# Patient Record
Sex: Female | Born: 1960 | Race: White | Hispanic: No | Marital: Married | Smoking: Never smoker
Health system: Southern US, Community
[De-identification: ages and names within clinical notes are randomized; demographics above are authoritative.]

## PROBLEM LIST (undated history)

## (undated) DIAGNOSIS — K5190 Ulcerative colitis, unspecified, without complications: Secondary | ICD-10-CM

## (undated) HISTORY — PX: PACEMAKER IMPLANT: EP1218

## (undated) HISTORY — PX: COLOPROCTECTOMY W/ ILEO J POUCH: SUR277

---

## 1997-05-30 ENCOUNTER — Other Ambulatory Visit: Admission: RE | Admit: 1997-05-30 | Discharge: 1997-05-30 | Payer: Self-pay | Admitting: Gynecology

## 1998-05-31 ENCOUNTER — Other Ambulatory Visit: Admission: RE | Admit: 1998-05-31 | Discharge: 1998-05-31 | Payer: Self-pay | Admitting: Gynecology

## 1998-08-21 ENCOUNTER — Inpatient Hospital Stay (HOSPITAL_COMMUNITY): Admission: EM | Admit: 1998-08-21 | Discharge: 1998-08-25 | Payer: Self-pay | Admitting: Gastroenterology

## 1999-06-25 ENCOUNTER — Encounter: Payer: Self-pay | Admitting: Family Medicine

## 1999-06-25 ENCOUNTER — Encounter: Admission: RE | Admit: 1999-06-25 | Discharge: 1999-06-25 | Payer: Self-pay | Admitting: Family Medicine

## 1999-06-25 ENCOUNTER — Inpatient Hospital Stay (HOSPITAL_COMMUNITY): Admission: EM | Admit: 1999-06-25 | Discharge: 1999-06-27 | Payer: Self-pay | Admitting: Emergency Medicine

## 1999-06-27 ENCOUNTER — Encounter: Payer: Self-pay | Admitting: Internal Medicine

## 1999-10-18 ENCOUNTER — Other Ambulatory Visit: Admission: RE | Admit: 1999-10-18 | Discharge: 1999-10-18 | Payer: Self-pay | Admitting: Gynecology

## 1999-12-07 ENCOUNTER — Ambulatory Visit (HOSPITAL_COMMUNITY): Admission: RE | Admit: 1999-12-07 | Discharge: 1999-12-07 | Payer: Self-pay | Admitting: Gastroenterology

## 1999-12-07 ENCOUNTER — Encounter (INDEPENDENT_AMBULATORY_CARE_PROVIDER_SITE_OTHER): Payer: Self-pay | Admitting: *Deleted

## 1999-12-10 ENCOUNTER — Inpatient Hospital Stay (HOSPITAL_COMMUNITY): Admission: EM | Admit: 1999-12-10 | Discharge: 1999-12-16 | Payer: Self-pay | Admitting: Gastroenterology

## 1999-12-10 ENCOUNTER — Encounter: Payer: Self-pay | Admitting: Internal Medicine

## 1999-12-10 ENCOUNTER — Encounter (INDEPENDENT_AMBULATORY_CARE_PROVIDER_SITE_OTHER): Payer: Self-pay | Admitting: Specialist

## 1999-12-11 ENCOUNTER — Encounter: Payer: Self-pay | Admitting: Internal Medicine

## 1999-12-12 ENCOUNTER — Encounter: Payer: Self-pay | Admitting: Internal Medicine

## 2000-01-01 ENCOUNTER — Encounter: Payer: Self-pay | Admitting: Gastroenterology

## 2000-01-01 ENCOUNTER — Ambulatory Visit (HOSPITAL_COMMUNITY): Admission: RE | Admit: 2000-01-01 | Discharge: 2000-01-01 | Payer: Self-pay | Admitting: Obstetrics & Gynecology

## 2000-01-22 ENCOUNTER — Encounter: Payer: Self-pay | Admitting: Emergency Medicine

## 2000-01-23 ENCOUNTER — Inpatient Hospital Stay (HOSPITAL_COMMUNITY): Admission: EM | Admit: 2000-01-23 | Discharge: 2000-01-25 | Payer: Self-pay | Admitting: Emergency Medicine

## 2000-01-24 ENCOUNTER — Encounter: Payer: Self-pay | Admitting: General Surgery

## 2000-02-14 ENCOUNTER — Encounter: Payer: Self-pay | Admitting: Emergency Medicine

## 2000-02-14 ENCOUNTER — Emergency Department (HOSPITAL_COMMUNITY): Admission: EM | Admit: 2000-02-14 | Discharge: 2000-02-14 | Payer: Self-pay | Admitting: Emergency Medicine

## 2000-06-13 ENCOUNTER — Encounter (HOSPITAL_COMMUNITY): Admission: RE | Admit: 2000-06-13 | Discharge: 2000-09-11 | Payer: Self-pay | Admitting: Gastroenterology

## 2001-05-04 ENCOUNTER — Other Ambulatory Visit: Admission: RE | Admit: 2001-05-04 | Discharge: 2001-05-04 | Payer: Self-pay | Admitting: Gynecology

## 2002-04-29 ENCOUNTER — Other Ambulatory Visit: Admission: RE | Admit: 2002-04-29 | Discharge: 2002-04-29 | Payer: Self-pay | Admitting: Gynecology

## 2003-10-20 ENCOUNTER — Other Ambulatory Visit: Admission: RE | Admit: 2003-10-20 | Discharge: 2003-10-20 | Payer: Self-pay | Admitting: Gynecology

## 2004-05-21 ENCOUNTER — Encounter: Admission: RE | Admit: 2004-05-21 | Discharge: 2004-05-21 | Payer: Self-pay | Admitting: Surgery

## 2004-12-29 ENCOUNTER — Encounter: Admission: RE | Admit: 2004-12-29 | Discharge: 2004-12-29 | Payer: Self-pay | Admitting: Family Medicine

## 2005-08-02 ENCOUNTER — Other Ambulatory Visit: Admission: RE | Admit: 2005-08-02 | Discharge: 2005-08-02 | Payer: Self-pay | Admitting: Gynecology

## 2006-06-12 ENCOUNTER — Inpatient Hospital Stay (HOSPITAL_COMMUNITY): Admission: RE | Admit: 2006-06-12 | Discharge: 2006-06-16 | Payer: Self-pay | Admitting: Neurosurgery

## 2006-07-11 ENCOUNTER — Ambulatory Visit: Admission: RE | Admit: 2006-07-11 | Discharge: 2006-07-11 | Payer: Self-pay | Admitting: Neurosurgery

## 2006-07-11 ENCOUNTER — Encounter (INDEPENDENT_AMBULATORY_CARE_PROVIDER_SITE_OTHER): Payer: Self-pay | Admitting: Neurosurgery

## 2006-07-11 ENCOUNTER — Ambulatory Visit: Payer: Self-pay | Admitting: Vascular Surgery

## 2007-03-10 ENCOUNTER — Other Ambulatory Visit: Admission: RE | Admit: 2007-03-10 | Discharge: 2007-03-10 | Payer: Self-pay | Admitting: Gynecology

## 2007-06-26 ENCOUNTER — Emergency Department (HOSPITAL_COMMUNITY): Admission: EM | Admit: 2007-06-26 | Discharge: 2007-06-27 | Payer: Self-pay | Admitting: Emergency Medicine

## 2010-06-22 NOTE — Consult Note (Signed)
[facility] ([facility])  Patient:    SHAMONICA, SCHADT                       MRN: [id number] Proc. Date: 12/10/99 Adm. Date:  [id number] Attending:  [place] Cellar                          Consultation Report  HISTORY OF PRESENT ILLNESS:  I was asked to see Ms. Lackman by Dr. Sandy Salaam. Kaplan for acute abdominal pain.  This is a very pleasant 50 year old white female who has been treated for ulcerative colitis for approximately eight years with good control.  Several weeks ago, she began to experience a typical exacerbation of her ulcerative colitis with intermittent crampy lower abdominal discomfort, diarrhea, some occasional blood and mucus in her stools and back pain.  She was started on prednisone 40 mg daily two weeks ago.  She did experience some improvement, although not complete resolution of her colitis symptoms.  To follow up her colitis, she underwent a colonoscopy by Dr. [name] on December 07, 1999.  Findings were consistent with moderate acute colitis throughout the colon.  The patient had no worsening of her symptoms immediately following the colonoscopy; however, the following day, she did have some increased lower abdominal pain that was not particularly severe.  Yesterday, however, she had the fairly rapid onset of severe, constant lower abdominal pain.  This has persisted and she is admitted today for further evaluation.  She describes pain located in the central portion of her low abdomen and across to both the left lower quadrant and right lower quadrant equally.  She has not had any increase in bleeding or diarrhea and actually had had some significant improvement prior to the onset of her pain with some semiformed stools.  She has not had any nausea or vomiting.  No definite fever or chills.  The pain is worse with any motion. She denies any current urinary symptoms.  She was treated for a urinary tract infection about two weeks ago with a short  course of oral antibiotics and the dysuria she was having at that time has resolved.  She has had no abnormal vaginal bleeding or discharge.  Of significance, she was admitted by Dr. Edsel Petrin. Dalbert Batman in April of this year with right lower quadrant abdominal pain.  There was concern about appendicitis but she resolved clinically.  Her current pain is more severe and more diffusely located across the lower abdomen than her previous symptoms. The patient was admitted today by Dr. [name] for further evaluation and treatment.  PAST MEDICAL HISTORY:  Ulcerative colitis, as above.  She has had occasional UTIs; otherwise, in good health.  CURRENT MEDICATIONS 1. Prednisone 40 mg a day. 2. Levbid b.i.d. 3. Levsin p.r.n. 4. Rowasa suppositories q.h.s. 5. ProctoFoam q.a.m. 6. Asacol 400 mg daily.  ALLERGIES:  She has no known drug allergies.  SOCIAL HISTORY:  She is married and with her husband this evening.  She does not smoke cigarettes.  Does not drink alcohol.  REVIEW OF SYSTEMS:  GENERAL:  Denies weight loss, fever or chills.  HEENT: Negative.  RESPIRATORY:  No cough or sputum.  CARDIAC:  No history of chest pain or heart problems.  ABDOMEN:  As above.  EXTREMITIES:  No edema or arthritis.  PHYSICAL EXAMINATION  VITAL SIGNS:  Temperature is 97.6, pulse 80, respirations 18, blood pressure 110/60.  GENERAL:  She is a  well-developed white female who appears uncomfortable but not severely ill.  SKIN:  Warm and dry.  HEENT:  Sclerae nonicteric.  LUNGS:  Clear.  CARDIAC:  Regular rate and rhythm without murmurs.  ABDOMEN:  Bowel sounds are normoactive.  No distention.  There is moderate-to-marked lower abdominal tenderness diffusely with guarding.  This does not localize to one side or the other.  Her upper abdomen is much less tender and soft.  No palpable masses or hepatosplenomegaly.  There are lower abdominal peritoneal signs with percussion tenderness and pain with  coughing.  RECTAL:  Deferred.  Moderate diffuse colitis seen on colonoscopy, December 07, 1999.  LABORATORY AND X-RAY FINDINGS:  This is significant for an elevated white count of 22,000, hemoglobin 12.7.  Chemistries and urinalysis are pending.  CT scan of the abdomen and the pelvis was performed which I have reviewed with the radiologist.  There are a few diverticula in the sigmoid colon that do not appear inflamed.  There is no bowel wall thickening.  The appendix appears to be visualized as a normal air-filled structure.  No significant amount of free fluid.  No free air.  ASSESSMENT AND PLAN:  Acute severe lower abdominal pain with physical findings to suggest lower abdominal peritonitis.  The etiology of this is unclear.  Her exam is not well-localized for diverticulitis or appendicitis and there are no findings on CT to suggest this; her steroids could result in lack of much inflammatory change on her CT with these diagnoses, however.  There is no free air to suggest perforation but I suppose she could have had a microperforation that has sealed.  The patient appears stable at this point and with no definitive surgical problem identified on CT scan, I agree with intravenous fluids, pain medication, broad-spectrum antibiotics and close clinical followup.  If she worsens at any point or fails to improve over the next day or two, she may require a laparoscopy or laparotomy. DD:  12/10/99 TD:  12/11/99 Job: [id number]

## 2010-06-22 NOTE — Op Note (Signed)
[facility]  Patient:    [name], [name]                       MRN: [id number] Proc. Date: 12/12/99 Adm. Date:  [id number] Attending:  [place] Cellar CC:         Sandy Salaam. [name], M.D. [facility]   Operative Report  PREOPERATIVE DIAGNOSIS:  Perforated viscus.  POSTOPERATIVE DIAGNOSIS:  Perforated sigmoid colon, probable diverticulitis.  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  SURGEON:  Ward Givens, M.D.  ANESTHESIA: General.  DESCRIPTION OF PROCEDURE:  After the patient was adequately anesthetized, monitored, had Foley catheter inserted, and after routine preparation and draping of the abdomen, I made a short midline incision beginning just above and ending just below the umbilicus.  I extended it enough to get in a retractor and get my hand in to explore the abdomen.  There was no pus or evident inflammation of the upper abdomen.  I could see the duodenal sweep quite well and saw no inflammation of the pancreas or the duodenum.  I found that the transverse colon was unremarkable except adherent to a mass in the pelvis.  The stomach was likewise unremarkable.  The liver and gallbladder showed no abnormalities.  Then, I pushed the transverse colon upward and saw that a good deal of small bowel was adherent in the pelvis into an inflammatory mass of the sigmoid colon and its mesentery.  It had the appearance of diverticulitis.  I extended my incision distally to provide good exposure and packed the bowel upward, taking down the inflammatory adhesions of the small bowel to the inflammatory mass.  I thoroughly inspected the small bowel to make sure that there was no primary perforation of the small bowel or perforation due to the infection and found that the small bowel was healthy.  I saw no real thickening of the bowel wall and found only inflammatory phlegmon in the sigmoid colon.   I could not identify a definite perforated spot.  I felt that it had probably been perforated into the mesentery of the sigmoid.  I divided the sigmoid colon at mid sigmoid where it appeared to be healthy including the proximal part with Kocher clamp, then divided the mesentery, staying well up away from the retroperitoneum source to avoid any possibility of injury to his ureter.  I got down to the rectosigmoid area.  I divided the bowel distally with a linear stapler and removed the sigmoid colon.  I then assured good hemostasis in the pelvic area.  I copiously irrigated the fluid which was present at the small bowel and removed the tenacious exudate which was present on the small bowel.  I then made a small hole just superior and lateral to the umbilicus and divided the anterior rectus sheath, cut a little bit of the rectus muscle, divided the posterior rectus sheath, and brought the proximal sigmoid through.  It came up comfortably.  I then closed the fascia with a running #1 PDS, beginning at both ends and tying in the middle. Sponge, needle, and instrument counts were correct.  I closed the skin with staples and, after maturing the colostomy, packed small pieces of gauze into the ______ to provide drainage.  I removed the clamp from the colostomy and found that the mucosa was pink and healthy.  I matured it with a combination of interrupted and running 4-0 Vicryl suture and applied a  colostomy appliance.  I applied a bulky bandage.  The patient was stable through the operation. DD:  12/12/99 TD:  12/13/99 Job: [id number]

## 2010-06-22 NOTE — H&P (Signed)
[facility]  Patient:    [name], [name]                       MRN: [id number] Adm. Date:  06/25/99 Attending:  Edsel Petrin. Dalbert Batman, M.D. CC:         Cleotis Nipper, M.D.             Youlanda Roys. [name], M.D.             Tana Conch. Mezer, M.D.                         History and Physical  CHIEF COMPLAINT:  right lower quadrant abdominal pain.  HISTORY OF PRESENT ILLNESS:  This is a 50 year old white female with a six year history of ulcerative colitis.  Thirty-six hours ago at 7:30 a.m. on Jun 24, 1999, she noticed the somewhat abrupt of right lower quadrant pain. The pain has been somewhat progressive since that time.  She had no nausea or vomiting and ate well yesterday.  She has eaten less today and does not have much appetite, but still no nausea or vomiting or diarrhea.  She specifically denies having any bloody stools.  She states that this pain is different than her usual flare-up of ulcerative colitis.  She has not had any fever or chills.  She states that it hurts to sneeze or cough hard.  She saw Dr. Marisue Humble today.  CBC showed a normal white count in the office today.  A CT scan was obtained at [facility] and was basically nondiagnostic, but did show a slightly distended appendix which was nonfluid filled and no surrounding inflammation and no pelvic pathology.  I have reviewed this with Dr. Derl Barrow, and she states that this is really a nondiagnostic scan and decisions will need to be made on clinical findings alone.  The patient had more diarrhea after the CAT scan, but again saw no blood.  The patient was referred to me for evaluation and consultation.  PAST HISTORY: 1. Ulcerative colitis for 6-7 years.  She has been hospitalized a couple of    times, last hospitalization was July 2000 at which time she had bloody    stools and right lower quadrant pain. 2. She has migraine headaches. 3. 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 4. She has had a tonsillectomy. 5. She has environmental allergies. 6. Her last menstrual period was Jun 08, 1999.  CURRENT MEDICATIONS: 1. Asacol four tablets t.i.d. 2. She takes allergy shots weekly. 3. Nasonex spray q.d. 4. She take Astelin spray q.d.  DRUG ALLERGIES:  None known.  SOCIAL HISTORY:  The patient is married.  Her husband has had a vasectomy and she states that she "cant be pregnant."  They have three children.  She works as a Electrical engineer.  Her husband is a IT consultant.  She denies the use of tobacco but drinks alcohol occasionally.  FAMILY HISTORY:  Father living at 3, possibly has ulcerative colitis, but has never been worked up.  Father has non-insulin-dependent diabetes.  Mother age 79, living and well.  Three siblings living and well.  REVIEW OF SYSTEMS:  All systems are reviewed and are negative, except as described above.  PHYSICAL EXAMINATION  GENERAL:  Extremely pleasant cooperative and intelligent white female who looks well and is in no acute distress.  VITAL SIGNS:  Temperature 97.0, respiratory rate 16, heart rate  74, blood pressure 127/73.  HEENT:  Sclerae clear.  Extraocular movements intact.  Oropharynx clear.  NECK:  Supple.  Nontender.  No mass.  No thyromegaly.  No bruit.  LUNGS:  Clear to auscultation, no CVA tenderness.  CARDIAC:  Heart regular rate and rhythm.  No murmurs.  ABDOMEN:  Soft and nondistended.  Bowel sounds are present.  She is somewhat tender in the right lower quadrant but really there is no guarding, rebound, or peritoneal size.  There is no mass or hernia.  RECTAL:  Exam by Dr. Marisue Humble shows no blood.  EXTREMITIES:  Good range of motion.  No deformity.  Good pulses.  No edema.  NEUROLOGIC:  Grossly within normal limits.  ADMISSION DATA:  Hemoglobin 12.2, white blood cell count 6500, with a normal differential.  Complete metabolic panel normal.  Urinalysis pending.  IMPRESSION:  Right lower  quadrant abdominal pain, of uncertain etiology. Appendicitis is possible, but felt to be a very atypical considering the absence of nausea and vomiting and a normal white count and the equivocal CT findings.  Viral gastritis of impending flare-up of ulcerative colitis is to be considered.  PLAN:  The patient will be admitted for observation.  She was given the observation for clinical progression versus proceeding with diagnostic laparoscopy at this time.  This has been discussed with her and her husband. Risks and benefits of each approach has been outlined.  They prefer admission for observation and to forego the laparoscopy at this time.  I feel that that is reasonable considering the very unclear nature of her diagnosis.  We will obtain another blood count in about six hours.  Would consider diagnostic laparoscopy with any progression.  Will consider GI consult if not. DD:  06/25/99 TD:  06/25/99 Job: [id number]

## 2010-06-22 NOTE — Op Note (Signed)
NAMEDERRICKA, MERTZ              ACCOUNT NO.:  [id number]   MEDICAL RECORD NO.:  [id number]          PATIENT TYPE:  INP   LOCATION:  2899                         FACILITY:  [facility]   PHYSICIAN:  Otilio Connors, M.D.  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   POSTOPERATIVE DIAGNOSIS:  Unstable spondylolisthesis at L4-L5 _______  and restenosis.   PROCEDURE:  Decompressive laminectomy, decompression of the L4 and L5  nerve roots (2 levels).  Posterolateral interbody fusion at L4-L5, saber  interbody cages at L4-L5,  EXPEDIUM nonsegmented pedicle screw fixation  at L4-L5, posterolateral fusion at L4-L5 with autograft, same incision  and infused BMP (allograft).   SURGEON:  Otilio Connors, M.D.   ASSISTANT:  Ophelia Charter, M.D.   ANESTHESIA:  General endotracheal tube anesthesia.   ESTIMATED BLOOD LOSS:  100 mL.   BLOOD GIVEN:  None.   DRAINS:  None.   COMPLICATIONS:  None.   REASON FOR PROCEDURE:  The patient is a 50 year old woman who had been  having bilateral leg pain and down to the deep with numbness.  MRI and x-  rays show unstable spondylolisthesis at L4-L5 and stenosis.  The patient  brought for decompression and fusion.   DESCRIPTION OF PROCEDURE:  The patient is brought to the operating room  and general anesthesia induced.  The patient was placed in the prone  position on the Wilson frame with all pressure points padded.  The  patient was prepped and draped in a sterile fashion and was injected  with 20 mL of 1% lidocaine with epinephrine.  An incision was then made  in the midline lower lumbar spine.  Incision taken down to fascia and  hemostasis was obtained with Bovie cauterization.  The fascia was  incised and on the left side subperiosteal dissection was done with L4-  L5  spinous process out to the facet.  Mercury was placed on the _______  space and x-ray was obtained from the position of the L4-L5.  Right-  sided periosteal dissection was then dissected out over the facet at L4-  L5.  Transverse process of L5 and dissecting cephalad dissecting the  transverse process of L4 bilaterally.  Self-retaining retractors were  placed so we could see this area.  The laminectomy at L4-L5 _______ with  spinous process and ligaments intact and fasciectomy was done on both  sides.  This decompressed the central ________.  We decompressed the 5  root foraminotomy with a 5 root, completely decompressed the 4 root by  removing the facet ________ of the foramen on each side.  Once we had a  good decompression of L4 and L5 roots.  L4-L5 disk space was explored  _________ disk bulge.  Disk space was incised and to a 50 blade  diskectomy with pituitary rongeurs.  Disks were scraped with the end  plates with the various approaches, per the interbody fusion and we  prepared the interspace  for interbody cage placements.  We distracted  interspace 2 mm.  We packed two 11 high x 9 wide saber cages with  infused BMP and autograft bone.  All the bone that was removed during  laminectomy was saved, chopped up in small pieces and used during the  fusion.  We placed autograft bone into the disk space and tapped the  cage into position, removed the distraction and then placed a second  cage in that position.  We explored the cages.  We had good position of  our cages with decompression, again the thecal sac and the nerve roots.  We decorticated the transverse process of the lateral facets 4 and 5  bilaterally.  ________ fluoroscopy union and using fluoroscopy as a  guide and intraoperative markers, decorticated pedicle ________ for L4  and placed a pedicle probe ________ circumference, tapped the hole,  checked it again with the small probe and then placed a pedicle screw.  This was  repeated on the other side of L4 with 50 mm screws were used.  This process was then done for the L5 pedicles, where 45 mm screws were  placed.  Rods were placed in the screw heads, locking nuts placed and we  locked down the nuts on the L5 screw and after some compression we took  down the nuts off the L4 screws.  The rest of the infused BMP and the  rest of the autograft bone graft was then placed in the postoperative  gutters with the transverse process for an L4-L5 fusion bilaterally.  We  then explored nerve roots, fecal sac.  We had good Decompression.  There  were no bone fragments near the nerve roots.  We placed some Gelfoam  over the nerve roots so no bone fragments could fall and compress them.  Retractors were removed with good hemostasis.  Fascia closed with 0  Vicryl interrupted sutures.  Subcutaneous tissue closed with 0, 2-0 and  3-0 Vicryl interrupted suture.  Skin closed with _______ Steri-Strips  and dressing was placed.  The patient was placed back in the supine  position, awakened from anesthesia and transferred to the recovery room  in stable condition.           ______________________________  Otilio Connors, M.D.     JRH/MEDQ  D:  06/12/2006  T:  06/12/2006  Job:  [id number]

## 2010-06-22 NOTE — Discharge Summary (Signed)
[facility]  Patient:    [name], [name]                       MRN: [id number] Adm. Date:  [id number] Disc. Date: 01/25/00 Attending:  Doyce Kelly CC:         [name], M.D.  [name], M.D.  [name]. [name], M.D.   Discharge Summary  DATE OF BIRTH:                1960/08/11  DISCHARGE DIAGNOSES:           1. Partial small bowel obstruction.                                   a. Presumed secondary to adhesions.                                   b. Resolved without intervention.                                2. Dehydration secondary to #1, resolved.                                3. Hypokalemia secondary to #2, resolved.                                4. Leukocytosis, resolved.                                5. Normocytic anemia.                                   a. Admission hemoglobin 12.2.  Discharge                                      hemoglobin 9.8.                                   b. No clinical evidence of bleeding.                                6. Ulcerative colitis.                                   a. Currently on prednisone.                                7. Chemical otitis media.                                   a. ?Eustachian tube regurgitation.  8. Status post cervical conization 1993.                                   a. Dysplasia.                                9. Mild depression.                               10. Perforated diverticulum six weeks ago.                                   a. Colostomy.  ALLERGIES:                    No known drug allergies.  DISCHARGE MEDICATIONS:        1. Prednisone 20 mg q.d.  Dr. Deatra Kelly is tapering                                  this as an outpatient.                               2. Sulfasalazine 500 mg two p.o. b.i.d.                               3. Levbid and Levsin as needed for abdominal                                  cramping.                         4. Effexor XR 75 mg q.d.  CONDITION ON DISCHARGE:       Stable.  Tolerating diet.  DISPOSITION:                  Home with husband.  RECOMMENDED DIET:             Low fat as tolerated.  RECOMMENDED ACTIVITY:         As tolerated.  No strenuous.  FOLLOW-UP:                    1. The patient has an appointment to see Dr.                                  Herbie Baltimore Kelly on Wednesday, December 26 at                                  11:15.  The purpose of this visit is to                                  reexamine her ear.  If she has trouble prior  to that visit she is to call Dr. Mikle Kelly or                                  come to the walk-in clinic.                               2. Appointment with Dr. Deatra Kelly on January 10 at                                  4:00.  If she has any more abdominal problems                                  she is to contact him.  CONSULTANTS:                  [name], M.D.                               2. [name]. [name]., M.D.  PROCEDURES:                   1. Acute abdominal series (December 18).  Chest                                  x-ray normal.  Moderate dilation of the small                                  bowel with air fluid levels.  No free air.                                  Minimal amount of gas in the colon. Question                                  small bowel obstruction.  There is                                  questionable ascites.                               2. Two view abdomen (December 19).  Dilated                                  loops less prominent.  Air fluid levels                                  remain.  Small bowel obstruction.                               3.  CT scan of the abdomen and pelvis (December                                  19).  Partial small bowel obstruction best                                  localized to the distal 10-15 cm of the  ileum                                  proximal to the ileostomy.  Status post                                  colectomy with [employer], physiological.                                   Small amount of free pelvic fluid.  No                                  adnexal masses.  Uterus present.                               4. Abdominal x-ray (December 20).  Interval                                  improvement with less small bowel gas.  One                                  prominent loop present.  HOSPITAL COURSE:              #1 - PARTIAL SMALL BOWEL OBSTRUCTION:  Mrs. [name] is a 50 year old with a history of ulcerative colitis who underwent resection of 8 inches of her colon approximately six weeks ago after perforating a diverticulum.  Approximately two days prior to admission she began having abdominal discomfort and a small amount of rectal bleeding.  She was seen and evaluated by Dr. Rosana Kelly and Dr. Deon Kelly.  Her symptoms worsened and she developed nausea and vomiting.  She was admitted for IV hydration and further evaluation.  Surgery and gastroenterology were consulted.  She was placed n.p.o. and her partial small bowel obstruction resolved without NG tube placement.  On the day of discharge she was tolerating a soft, solid diet.  It is felt that this obstruction was likely due to adhesions.  She will contact us if she develops further problems.                                #2 - CHEMICAL OTITIS MEDIA:  Mrs. [name] during one of her episodes of wretching felt sudden fullness in her left ear (after vomiting).  She then developed pain on December 20.  Examination of the ear revealed a slightly erythematous ear canal, but no significant tenderness  with pinna movement.  Her eardrum was red and bulging.  We opted to follow this clinically and by the following day the pain had improved.  There is no clear indication for prophylactic antibiotics in this instance.  At the time  of  discharge she was noting some buzzing in her ear on the left side when she would lay on her side.  Visual examination revealed decreased redness to the ear canal, resolution of bulging, and mild erythema of the tympanic membrane. She was instructed to call if she had problems, then she will see Dr. Mikle Kelly on December 26 for a recheck.                                #3 - ANEMIA:  Mrs. Shedden had an admission hemoglobin of 12.  She was quite dehydrated and I suspect hemoconcentrated. Her discharge hemoglobin was 9.8.  Recommend follow up in two to three weeks and consider iron studies if indicated. DD:  01/25/00 TD:  01/25/00 Job: [id number]

## 2010-06-22 NOTE — Discharge Summary (Signed)
[place]  Patient:    [name], [name]                       MRN: [id number] Adm. Date:  [id number] Disc. Date: [id number] Attending:  Barbera Setters CC:         Cleotis Nipper, M.D.             Sandy Salaam. [name], M.D. [facility]             Liam Graham, M.D.                           Discharge Summary  FINAL DIAGNOSES: 1. Right lower quadrant pain, possible viral gastroenteritis, possible    flare-up of ulcerative colitis, doubt appendicitis. 2. Past history of ulcerative colitis. 3. Migraine headaches. 4. History of cervical dysplasia.  OPERATIONS:  None.  HISTORY:  This is a 50 year old white female with a six-year history of ulcerative colitis.  She presents with a 21 year old history of right lower quadrant pain, which had been progressive.  She denied fever, nausea, or vomiting and has been eating well.  Denies bloody stools.  She feels that this pain is different from her usual flare-ups of ulcerative colitis.  She saw Dr. Marisue Humble in the office the day of admission.  A CBC showed a normal white count.  A CT scan obtained at [facility] was nondiagnostic but did show a slightly distended appendix, which was non-fluid filled and no surrounding inflammation, and no pelvic pathology.  The radiologists have reviewed this on more than one occasion and agree that this is nondiagnostic.  She was admitted for further evaluation.  For details of her past history, social history, and family history please see the detailed admission note.  PHYSICAL EXAMINATION:  GENERAL:  A very pleasant and cooperative white female who looks well and was in no acute distress.  VITAL SIGNS:  Temperature 97.0, respiratory rate 16, heart rate 74, blood pressure 127/73.  LUNGS:  Clear to auscultation.  HEART:  Regular rate and rhythm.  No murmur.  ABDOMEN:  Soft and nondistended.  Bowel sounds present.  Somewhat tender in the right lower quadrant but  really no guarding, no rebound, and no peritoneal signs.  No masses, no hernia.  RECTAL:  Exam by Dr. Marisue Humble showed no blood.  ADMISSION LABORATORY DATA:  Hemoglobin 12.2, white cell count 6500, normal differential.  Complete metabolic panel normal.  HOSPITAL COURSE:  The patient was admitted for observation.  It was felt that her pain, physical findings, radiographic findings, and laboratory work were inconsistent with appendicitis.  It was felt that this was possible viral gastroenteritis and possible ulcerative colitis.  The patient was seen in consultation by Dr. Delfin Edis and she likewise was unclear as to the etiology of the pain.  She wondered if this was Crohns or ulcerative colitis.  She wondered about possible small bowel obstruction or hernia.  On the morning of Jun 26, 1999, the right lower quadrant pain and tenderness was diminished but had not completely resolved.  A barium enema was obtained on Jun 27, 1999 and this showed no evidence of deformity, terminal ileum looked normal.  The appendix was not definitely identified but no evidence of mass effect or inflammation.  We advanced her diet and she did well.  She was discharged home on Jun 27, 1999.  FOLLOW-UP:  She was to follow up with Dr. Herbie Baltimore  [name] on July 11, 1999 in the office.  She was to follow up with Dr. Dalbert Batman as needed.  DISCHARGE MEDICATIONS: 1. Asacol 1600 mg t.i.d. 2. Levsin 0.125 mg sublingual q.8h. p.r.n. crampy pain. 3. Continue Nasonex. 4. Continue allergy shots as before. DD:  08/08/99 TD:  08/08/99 Job: [id number]

## 2010-06-22 NOTE — Discharge Summary (Signed)
[facility]  Patient:    [name], [name]                       MRN: [id number] Adm. Date:  [id number] Disc. Date: [id number] Attending:  Clawson Cellar                           Discharge Summary  HISTORY OF PRESENT ILLNESS:  The patient is a 50 year old white female admitted to the hospital by Dr. [name] because of lower abdominal pain.  The patient was several days status post colonoscopy with biopsies in the sigmoid. She has a history of colitis and is on steroid medication.  She had been admitted for an episode of pain several months ago.  See previous records for further details.  HOSPITAL COURSE:  The patient was admitted by Dr. [name] and cared for by him. The patient was seen by myself and my associated on November 5, when was first seen by Dr. Excell Seltzer.  She had an elevated white count.  CT of the abdomen was negative, and the appendix was visualized even though it was thought she might have a appendicitis.  We continued to follow closely.  Her pain worsened, and so on November 7, a second CT was recommended.  This was done and showed pneumoperitoneum with inflammation in the pelvic area.  I saw the patient and recommended operation.  I found that she had perforation of the sigmoid colon with localized abscess and peritonitis.  I did a sigmoid colectomy with colostomy, and the patient generally recovered well thereafter.  There was no wound infection or other infectious problems.  Her colostomy functioned well, and her wound was healing nicely at time of discharge.  Pathology showed diverticulitis with pericolonic abscess and perforation.  There was no malignancy, and there was no comment made about any inflammatory disease of the colon.  DISCHARGE FOLLOWUP:  She is to see me in a couple of weeks.  Reversal of the colostomy is anticipated in a period of about four months if the patient desires and if she is felt to be in good condition for  this by Dr. [name] and his associates.  FINAL DIAGNOSES: 1. Diverticulitis of the sigmoid colon with perforation. 2. Ulcerative colitis.  OPERATIONS PERFORMED:  Sigmoid colectomy with colostomy and stapling of the distal segment.  DISCHARGE CONDITION:  Improved. DD:  01/07/00 TD:  01/07/00 Job: [id number]

## 2010-06-22 NOTE — H&P (Signed)
[name]  Patient:    [name], [name]                       MRN: [id number] Adm. Date:  [id number] Attending:  Millerton Cellar Dictator:   [name], P.A.C. CC:         [name]. Dalbert Batman, M.D.   History and Physical  CHIEF COMPLAINT:  Severe lower abdominal pain.  HISTORY:  [name] is a very nice 50 year old white female, known to Dr. Sandy Salaam. [name], who is generally in good health, but with long history of ulcerative colitis.  Patient has had a recent exacerbation over the past few weeks and had been started on prednisone 40 mg q.d. over the past two weeks; she is also on chronic Asacol therapy.  She had been on 6-MP in the past which had then been stopped and due to some ongoing active colitis over the past several months, Dr. Deatra Kelly had wanted to be resume 6-MP; however, the patient has been having insurance issues and had planned to wait until January to resume the 6-MP due to cost.  Patient says that she also had a urinary tract infection about a week and a half ago which was treated.  She underwent colonoscopy due to her recent exacerbation on Friday, December 07, 1999, with Dr. Deatra Kelly.  She had finding of patchy acute colitis throughout the colon which was active but not severe.  No changes were made in her medications. She said she felt fine Friday evening after the procedure and felt fine all day Saturday and has been having about four or five bowel movements per day with less bleeding and less lower abdominal cramping.  She said she developed lower abdominal discomfort across her lower abdomen and below the umbilicus and into the lower back Sunday morning and that this progressed somewhat through the day.  She had no dysuria or hematuria associated, no fever, but did have some chills.  She says when the pain became a bit worse, she was nauseated but had not vomited.  She had spoken with Dr. Deatra Kelly a couple of times yesterday and did  not want to come to the hospital.  He increased her Levsin and told her to take the [place] regularly as well as hydrocodone which she had at home.  She says last evening at about 9 p.m., she developed abrupt worsening of her pain which was "10 times worse" than it had been.  It has been constant and severe since that time.  She has had a couple of bowel movements through the night which increased her pain and also complains of increased rectal discomfort and spasm today.  She has not had any bloody stools through the night.  She was advised this morning to come on to the hospital for further diagnostic evaluation.  Of note, colonoscopy did show a few scattered left colon and sigmoid diverticula.  She has had no prior history of diverticulitis.  Interestingly, the patient also had an admission to [facility] in May of 2001 and was seen by Dr. Edsel Kelly. Ingram at that time with complaints of lower abdominal and predominant right lower quadrant abdominal pain.  She had a CT scan at that time which was suspicious for appendicitis; she also had a small ovarian cyst; however, she did not have any leukocytosis at that time and her pain improved rather spontaneously and therefore a decision was made not to operate.  She says this pain  is similar but more diffuse.  Patient is quite uncomfortable at the time of admission and is admitted for pain control and further diagnostic workup and surgical consultation if indicated.  CURRENT MEDICATIONS 1. Prednisone 40 mg p.o. q.d. 2. Asacol 400 mg, nine tablets q.d. 3. Hydrocodone as needed. 4. Levbid one p.o. b.i.d. 5. Levsin p.r.n. 6. Kanasa suppositories q.h.s. 7. ProctoFoam b.i.d. since December 07, 1999.  ALLERGIES:  No known drug allergies.  PAST HISTORY 1. Ulcerative colitis. 2. History of urinary tract infections. 3. Seasonal allergies.  FAMILY HISTORY:  Father with hypertension and question of ulcerative colitis. Mother alive and well.   Maternal grandmother with diabetes.  SOCIAL HISTORY:  The patient is married.  She does have children.  She is a Agricultural engineer.  No tobacco and no ETOH.  REVIEW OF SYSTEMS:  CARDIOVASCULAR AND PULMONARY:  Negative.  GENITOURINARY: Negative currently.  MUSCULOSKELETAL:  Negative currently.  GI:  As outlined above.  PHYSICAL EXAMINATION  GENERAL:  Well-developed white female in no acute distress.  She is uncomfortable-appearing but nontoxic.  She is alert and oriented x 3.  VITAL SIGNS:  Temperature is 97.6.  Blood pressure 101/53.  Pulse is 80.  HEENT:  Non-traumatic.  Normocephalic.  EOMI.  PERLA.  Sclerae anicteric. Buccal mucosa is dry.  PULMONARY:  Clear to A&P.  ABDOMEN:  Soft.  Bowel sounds are present but hypoactive.  She is diffusely tender across the lower abdomen with rebound across the lower abdomen.  No guarding, distention or mass.  RECTAL:  Exam not done today; see recent colonoscopy note, December 07, 1999.  EXTREMITIES:  Without clubbing, cyanosis, or edema.  No rash.  IMPRESSION:  [age]-year-old white female with known ulcerative colitis with recent exacerbation, status post colonoscopy, December 07, 1999, now with acute severe bilateral lower abdominal pain x 12 hours; rule out appendicitis; rule out microperforation of the colon; rule out other acute pelvic pathology. Doubt her current pain is secondary to colitis.  PLAN:  The patient is admitted to the service of Dr. Lowella Bandy. Olevia Perches, who is covering the hospital.  She will be kept n.p.o.  We will obtain STAT abdominal films and CT scan of the abdomen and pelvis, start her on IV Unasyn, IV Solu-Medrol, pain control with Demerol and Phenergan.  Likely will need surgical consultation.  For further details, please see the orders. DD:  12/10/99 TD:  12/10/99 Job: [id number]

## 2010-06-22 NOTE — H&P (Signed)
[facility]  Patient:    [name], [name]                       MRN: [id number] Adm. Date:  [id number] Attending:  Doyce Para CC:         Ward Givens, M.D.  Sandy Salaam. [name], M.D. [facility]   History and Physical  ADMISSION DIAGNOSES: 1. Abdominal pain. 2. Nausea and vomiting. 3. Ileus. 4. Leukocytosis.  HISTORY OF PRESENT ILLNESS:  The patient is a 50 year old white female with a history of ulcerative colitis status post resection of 8 inches of colon about 6 weeks ago for a perforated diverticulum who presents to the emergency room complaining of a two-day history of increasing abdominal pain, especially after eating or drinking anything. About 6 weeks ago, the patient had abdominal pain, atypical for her usual ulcerative colitis pain, and was seen by Sandy Salaam. [name], M.D. The etiology was unclear. The patient was admitted for approximately 4 days with no etiology found until repeated x-rays showed free air in her abdomen. A colonoscopy early in that admission showed active ulcerative colitis; but per the patient and husband, there was no mention of diverticulosis found on the colonoscopy. Exploratory laparotomy done by Ward Givens, M.D. revealed a perforated diverticulum, and 8 inches of her colon were resected with a colostomy placed. The patient had been recovering fairly well until about 2 days ago when she began to have abdominal pain. She also noted a small amount of rectal bleeding. There was some nausea, as well. The patient was seen December 17 by Lew Dawes. Rosana Hoes, M.D., and he felt that the pain was probably not a postoperative issue. She was also seen by Ward Givens, M.D. on December 18, and he agreed that the abdominal pain did not seem to be related to the colostomy or a postoperative etiology. He apparently commented to the patient and her husband that the small amount of rectal bleeding, however, could be a normal  postoperative finding. The patient was at home this evening trying to manage her symptoms there when she began to complain to her husband of some left leg numbness and right finger tingling which apparently produced some anxiety and hyperventilation. Her husband was unable to calm her; and so, therefore, called EMS. The husband also states that he felt that she was feverish tonight, but no temperature was taken at home. She has had very little p.o. intake for 2 to 3 days. In the emergency room, the patient demonstrated left abdominal pain with a temperature of 97 and x-rays consistent with an ileus with a white count of 19,000. Her urine specific gravity was 1.025 and positive for ketones. She will be admitted for ileus, nausea, vomiting, and dehydration. Apparently, Pricilla Riffle. Dagoberto Ligas., M.D. was called by the emergency physician, and he suggested that I be called to admit her for this problem.  PAST MEDICAL HISTORY: 1. Ulcerative colitis diagnosed in approximately 1993. Sees Sandy Salaam. [name],    M.D. for care of ulcerative colitis and gastrointestinal issues. 2. 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  CURRENT MEDICATIONS: 1. Prednisone 20 mg a day. This is being tapered down approximately 5 mg every    10 days or so. 2. Sulfasalazine 500 mg pills, 2 p.o. b.i.d. per the patient. 3. Levbid and Levsin p.r.n. intestinal cramps. 4. Effexor XR 75 mg q.d. which she has taken for about 3 weeks. Apparently,    this is  helping some postoperative depression.  ALLERGIES:  None.  FAMILY HISTORY:  Significant for her father having coronary artery disease.  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 She does not smoke and rarely drinks alcohol.  REVIEW OF SYSTEMS:  As per the history of present illness above. Additionally, there is noted to be a fairly large colostomy output today along with the nausea and vomiting. Negative review of  systems for headache, rash, or genitourinary symptoms.  PHYSICAL EXAMINATION:  GENERAL:  Reveals the patient to be drowsy after morphine and Phenergan had recently given.  VITAL SIGNS:  Blood pressure 110/70, pulse 90, respiratory rate 16.  HEENT:  Dry mucous membranes.  NECK:  No JVD.  HEART:  Regular rate and rhythm without murmur.  LUNGS:  Clear.  ABDOMEN:  Soft with decreased bowel sounds and tenderness in the left upper quadrant just superior to the colostomy site. The colostomy site actually looks to be well healed without surrounding erythema.  LABORATORY DATA:  Abdominal x-rays consistent with an ileus.  Hemoglobin of 12.2, with a white count of 19.4, and 89% neutrophils, with a platelet count of 355,000. Her amylase is normal at 121. Urinalysis shows a specific gravity of 1.026 and positive for ketones and positive for leukocyte esterase. Chemistries show a sodium of 141, with a potassium of 3.1, chloride of 107, bicarb 24, BUN 14, creatinine 1, glucose 113, albumin 3.8, SGOT 22, SGPT 15.  IMPRESSION:  The patient is a 50 year old white female with the ulcerative colitis, 6 weeks status post colostomy with a two-day history of increasing abdominal pain, nausea, vomiting, and increased colostomy output today even in the face of very limited oral intake. Her x-rays are consistent with an ileus, but she also shows some leukocytosis which is worrisome in the face of her recent diverticulosis and perforated diverticulum.  1. Abdominal pain. Unclear if this is due to a viral gastroenteritis,    diverticular disease, or another perforation. Fortunately, there is no free    air seen on her abdominal x-rays; though apparently, this also did not show    up for several days with her recent hospitalization. Will be admitted for    pain control, kept n.p.o., will begin IV Cipro and Flagyl in case    diverticulitis is the etiology. 2. Dehydration. Bolus IV fluids and continue  as a maintenance along with     replacement of potassium. 3. Steroids. Will change her from her prednisone to low-dose IV Solu-Medrol    given the questionable GI transit and absorption with her current state. 4. Positive leukocyte esterase on urinalysis. She is asymptomatic. We will    send a culture and follow up pending results. 5. Leukocytosis. Unclear if this is due to an infection, steroids, or other.    Will treat infectious etiology and follow her counts. DD:  01/23/00 TD:  01/23/00 Job: [id number]

## 2010-06-22 NOTE — Consult Note (Signed)
[facility]  Patient:    [name], [name]                       MRN: [id number] Proc. Date: 01/23/00 Adm. Date:  [id number] Attending:  Doyce Para CC:         Doyce Para, M.D.  Cleotis Nipper, M.D.   Consultation Report  REASON FOR CONSULTATION:  Small bowel obstruction.  HISTORY OF PRESENT ILLNESS:  This is a 50 year old white female with a long standing history of ulcerative colitis.  About seven weeks ago, she presented to the hospital with abdominal pain and was found to have a pneumoperitoneum. She was operated on by Dr. Georgina Quint and underwent sigmoid colectomy with colostomy for what was thought to be perforated diverticulitis.  She did well following that surgery and was discharged home.  She remains on steroids and other medications for her ulcerative colitis.  She became ill on Sunday evening, January 20, 2000.  She complains of steady, left-sided, mid abdominal pain and no bowel movement since Sunday evening December 16.  Yesterday, she had several episodes of vomiting and was admitted.  Since that time, she has had a large amount of stool per colostomy and feels much better.  She states that her nausea has resolved.  She is hungry and she has a minimal amount of soreness.  When she was admitted late last night, her white blood cell count was 19,000. That has gone down to 14,000 today.  Her abdominal films yesterday are consistent with a high grade partial distal small bowel obstruction.  X-rays today look much less dramatic, although they are still consistent with partial small bowel obstruction, but now with much more gas in the colon.  She underwent a CT scan this evening, which is consistent with partial distal small bowel obstruction with some dilated proximal small bowel and collapsed distal small bowel.  There is no abscess or fluid collection.  There is no evidence of bowel ischemia.  PAST MEDICAL HISTORY: 1.  Ulcerative colitis diagnosed in 1993, followed by Dr. Erskine Emery. 2. Status post cervical conization secondary to dysplasia in 1993. 3. Status post sigmoid colectomy with colostomy (Hartmann procedure) six weeks    ago.  CURRENT MEDICATIONS: 1. Prednisone 20 mg q.d. with a slow taper going on. 2. Sulfasalazine 500 mg, two tablets b.i.d. (per patient). 3. Levbid p.r.n. 4. Levsin p.r.n. 5. Effexor XR 75 mg q.d. x 3 weeks for postoperative depressive symptoms.  DRUG ALLERGIES:  None known.  FAMILY HISTORY:  Her father has coronary artery disease.  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  No tobacco.  Rare alcohol.  PHYSICAL EXAMINATION:  GENERAL:  A very pleasant young, middle-aged white female sitting up in a chair in no distress.  VITAL SIGNS:  Temperature 97.0, blood pressure 110/70, pulse 90 and regular, respiratory rate 16.  HEENT:  Sclerae clear.  Extraocular movements intact.  Mucous membranes dry.  HEART:  Regular rate and rhythm without murmur.  LUNGS:  Clear to auscultation.  No CVA tenderness.  ABDOMEN:  Soft.  Bowel sounds  are present.  The abdomen is not distended to exam.  There is mild tenderness in the left mid abdomen around the stoma, but there is no hernia, no cellulitis, no mass, and no abscess.  She is not guarding.  She has lots of liquid, light brown stool in the colostomy bag.  IMPRESSION:  Partial distal  small bowel obstruction, almost certainly secondary to adhesions from her recent laparotomy and colectomy.  No evidence of abscess.  No evidence of hernia.  PLAN:  At this time, the patient seems to be getting better spontaneously and would opt for non-operative management with clinical observation, n.p.o. status and IV fluid hydration.  We will repeat lab work and x-rays tomorrow morning.  If she vomits, she certainly would need an NG tube, although she is very opposed to that at this time.  Would  reserve laparotomy and lysis of adhesions as a last resort. DD:  01/23/00 TD:  01/24/00 Job: [id number]

## 2010-06-22 NOTE — Discharge Summary (Signed)
NAMESUPRIYA, BEASTON              ACCOUNT NO.:  [id number]   MEDICAL RECORD NO.:  [id number]          PATIENT TYPE:  INP   LOCATION:  3013                         FACILITY:  [facility]   PHYSICIAN:  Otilio Connors, M.D.  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   DISCHARGE DIAGNOSIS:  Unstable spondylolisthesis L4-5 with recessed  stenosis and spondylosis.   PROCEDURE:  Decompression, laminectomy and decompression at L4 and L5.  Posterolateral fusion of L4-5 at the sacral interbody cages.  Nonsegmented pedicle screw fixation.  Posterolateral fusion of Allograft and infuse and BNP.   REASON FOR ADMISSION:  The patient is a 50 year old woman who has been  having a lot of leg pain and numbness with back pain.  MRI and x-ray  show unstable spondylolisthesis and stenosis at L4-5.  The patient  brought in for decompression and fusion.   HOSPITAL COURSE:  The patient is admitted the day of surgery and  underwent procedure without complication to postop.  The patient was  transferred to the recovery room and then the floor.  There she thought  she had some less pain radiating down to her feet.  She had some  significant incisional pain that started increasing after activity.  Physical therapy worked with the patient to increase mobilization  including the L4-5 working on the brace.  She started mobilizing fairly  slowly and on Jun 15, 2006, was changed to p.o. pain medications.  She  continued making some progress.  Did have episodes of spasms.  She used  muscle relaxants for that.  The incision remained dry and intact.  She  was discharged home in stable condition on Jun 16, 2006.   DISCHARGE MEDICATIONS:  Remained the same as pre-hospitalization plus  Percocet and Flexeril p.r.n.  Wear her brace when up.   No strenuous  activity.  Follow up in 3 weeks in my office.           ______________________________  Otilio Connors, M.D.     JRH/MEDQ  D:  08/05/2006  T:  08/05/2006  Job:  [id number]

## 2011-09-04 DIAGNOSIS — K91850 Pouchitis: Secondary | ICD-10-CM | POA: Insufficient documentation

## 2014-07-12 ENCOUNTER — Other Ambulatory Visit: Payer: Self-pay | Admitting: Gynecology

## 2014-07-13 LAB — CYTOLOGY - PAP

## 2014-09-22 DIAGNOSIS — J342 Deviated nasal septum: Secondary | ICD-10-CM | POA: Insufficient documentation

## 2014-11-08 DIAGNOSIS — K219 Gastro-esophageal reflux disease without esophagitis: Secondary | ICD-10-CM | POA: Insufficient documentation

## 2014-11-08 DIAGNOSIS — G43909 Migraine, unspecified, not intractable, without status migrainosus: Secondary | ICD-10-CM | POA: Insufficient documentation

## 2014-11-08 DIAGNOSIS — G629 Polyneuropathy, unspecified: Secondary | ICD-10-CM | POA: Insufficient documentation

## 2014-11-08 DIAGNOSIS — K5190 Ulcerative colitis, unspecified, without complications: Secondary | ICD-10-CM | POA: Insufficient documentation

## 2014-11-08 DIAGNOSIS — G2581 Restless legs syndrome: Secondary | ICD-10-CM | POA: Insufficient documentation

## 2015-05-08 DIAGNOSIS — Z885 Allergy status to narcotic agent status: Secondary | ICD-10-CM | POA: Diagnosis not present

## 2015-05-08 DIAGNOSIS — Z91040 Latex allergy status: Secondary | ICD-10-CM | POA: Diagnosis not present

## 2015-05-08 DIAGNOSIS — K91850 Pouchitis: Secondary | ICD-10-CM | POA: Diagnosis not present

## 2015-05-08 DIAGNOSIS — R195 Other fecal abnormalities: Secondary | ICD-10-CM | POA: Diagnosis not present

## 2015-05-08 DIAGNOSIS — Z87891 Personal history of nicotine dependence: Secondary | ICD-10-CM | POA: Diagnosis not present

## 2015-05-08 DIAGNOSIS — R159 Full incontinence of feces: Secondary | ICD-10-CM | POA: Diagnosis not present

## 2015-05-08 DIAGNOSIS — R278 Other lack of coordination: Secondary | ICD-10-CM | POA: Diagnosis not present

## 2015-05-09 DIAGNOSIS — M545 Low back pain: Secondary | ICD-10-CM | POA: Diagnosis not present

## 2015-05-09 DIAGNOSIS — G039 Meningitis, unspecified: Secondary | ICD-10-CM | POA: Diagnosis not present

## 2015-05-12 DIAGNOSIS — J3081 Allergic rhinitis due to animal (cat) (dog) hair and dander: Secondary | ICD-10-CM | POA: Diagnosis not present

## 2015-05-12 DIAGNOSIS — J301 Allergic rhinitis due to pollen: Secondary | ICD-10-CM | POA: Diagnosis not present

## 2015-05-12 DIAGNOSIS — J3089 Other allergic rhinitis: Secondary | ICD-10-CM | POA: Diagnosis not present

## 2015-05-15 DIAGNOSIS — R195 Other fecal abnormalities: Secondary | ICD-10-CM | POA: Diagnosis not present

## 2015-05-15 DIAGNOSIS — Z87891 Personal history of nicotine dependence: Secondary | ICD-10-CM | POA: Diagnosis not present

## 2015-05-15 DIAGNOSIS — Z91040 Latex allergy status: Secondary | ICD-10-CM | POA: Diagnosis not present

## 2015-05-15 DIAGNOSIS — R278 Other lack of coordination: Secondary | ICD-10-CM | POA: Diagnosis not present

## 2015-05-15 DIAGNOSIS — R159 Full incontinence of feces: Secondary | ICD-10-CM | POA: Diagnosis not present

## 2015-05-15 DIAGNOSIS — K91850 Pouchitis: Secondary | ICD-10-CM | POA: Diagnosis not present

## 2015-05-15 DIAGNOSIS — Z885 Allergy status to narcotic agent status: Secondary | ICD-10-CM | POA: Diagnosis not present

## 2015-05-16 DIAGNOSIS — J3089 Other allergic rhinitis: Secondary | ICD-10-CM | POA: Diagnosis not present

## 2015-05-16 DIAGNOSIS — J3081 Allergic rhinitis due to animal (cat) (dog) hair and dander: Secondary | ICD-10-CM | POA: Diagnosis not present

## 2015-05-16 DIAGNOSIS — J301 Allergic rhinitis due to pollen: Secondary | ICD-10-CM | POA: Diagnosis not present

## 2015-05-26 DIAGNOSIS — J3089 Other allergic rhinitis: Secondary | ICD-10-CM | POA: Diagnosis not present

## 2015-05-26 DIAGNOSIS — R159 Full incontinence of feces: Secondary | ICD-10-CM | POA: Diagnosis not present

## 2015-05-26 DIAGNOSIS — Z87891 Personal history of nicotine dependence: Secondary | ICD-10-CM | POA: Diagnosis not present

## 2015-05-26 DIAGNOSIS — K91850 Pouchitis: Secondary | ICD-10-CM | POA: Diagnosis not present

## 2015-05-26 DIAGNOSIS — Z885 Allergy status to narcotic agent status: Secondary | ICD-10-CM | POA: Diagnosis not present

## 2015-05-26 DIAGNOSIS — R195 Other fecal abnormalities: Secondary | ICD-10-CM | POA: Diagnosis not present

## 2015-05-26 DIAGNOSIS — R278 Other lack of coordination: Secondary | ICD-10-CM | POA: Diagnosis not present

## 2015-05-26 DIAGNOSIS — Z91040 Latex allergy status: Secondary | ICD-10-CM | POA: Diagnosis not present

## 2015-05-26 DIAGNOSIS — J301 Allergic rhinitis due to pollen: Secondary | ICD-10-CM | POA: Diagnosis not present

## 2015-05-26 DIAGNOSIS — J3081 Allergic rhinitis due to animal (cat) (dog) hair and dander: Secondary | ICD-10-CM | POA: Diagnosis not present

## 2015-05-30 DIAGNOSIS — M545 Low back pain: Secondary | ICD-10-CM | POA: Diagnosis not present

## 2015-06-02 DIAGNOSIS — J3089 Other allergic rhinitis: Secondary | ICD-10-CM | POA: Diagnosis not present

## 2015-06-02 DIAGNOSIS — J301 Allergic rhinitis due to pollen: Secondary | ICD-10-CM | POA: Diagnosis not present

## 2015-06-02 DIAGNOSIS — J3081 Allergic rhinitis due to animal (cat) (dog) hair and dander: Secondary | ICD-10-CM | POA: Diagnosis not present

## 2015-06-05 DIAGNOSIS — R159 Full incontinence of feces: Secondary | ICD-10-CM | POA: Diagnosis not present

## 2015-06-05 DIAGNOSIS — Z87891 Personal history of nicotine dependence: Secondary | ICD-10-CM | POA: Diagnosis not present

## 2015-06-05 DIAGNOSIS — R278 Other lack of coordination: Secondary | ICD-10-CM | POA: Diagnosis not present

## 2015-06-05 DIAGNOSIS — Z885 Allergy status to narcotic agent status: Secondary | ICD-10-CM | POA: Diagnosis not present

## 2015-06-05 DIAGNOSIS — Z91040 Latex allergy status: Secondary | ICD-10-CM | POA: Diagnosis not present

## 2015-06-05 DIAGNOSIS — R195 Other fecal abnormalities: Secondary | ICD-10-CM | POA: Diagnosis not present

## 2015-06-05 DIAGNOSIS — K91850 Pouchitis: Secondary | ICD-10-CM | POA: Diagnosis not present

## 2015-06-07 DIAGNOSIS — J301 Allergic rhinitis due to pollen: Secondary | ICD-10-CM | POA: Diagnosis not present

## 2015-06-07 DIAGNOSIS — J3089 Other allergic rhinitis: Secondary | ICD-10-CM | POA: Diagnosis not present

## 2015-06-07 DIAGNOSIS — J3081 Allergic rhinitis due to animal (cat) (dog) hair and dander: Secondary | ICD-10-CM | POA: Diagnosis not present

## 2015-06-08 ENCOUNTER — Encounter: Payer: Self-pay | Admitting: Obstetrics and Gynecology

## 2015-06-12 ENCOUNTER — Encounter: Payer: Self-pay | Admitting: Obstetrics and Gynecology

## 2015-06-13 DIAGNOSIS — J301 Allergic rhinitis due to pollen: Secondary | ICD-10-CM | POA: Diagnosis not present

## 2015-06-13 DIAGNOSIS — J3089 Other allergic rhinitis: Secondary | ICD-10-CM | POA: Diagnosis not present

## 2015-06-13 DIAGNOSIS — J3081 Allergic rhinitis due to animal (cat) (dog) hair and dander: Secondary | ICD-10-CM | POA: Diagnosis not present

## 2015-06-19 DIAGNOSIS — J3089 Other allergic rhinitis: Secondary | ICD-10-CM | POA: Diagnosis not present

## 2015-06-19 DIAGNOSIS — J3081 Allergic rhinitis due to animal (cat) (dog) hair and dander: Secondary | ICD-10-CM | POA: Diagnosis not present

## 2015-06-19 DIAGNOSIS — J301 Allergic rhinitis due to pollen: Secondary | ICD-10-CM | POA: Diagnosis not present

## 2015-06-22 DIAGNOSIS — J301 Allergic rhinitis due to pollen: Secondary | ICD-10-CM | POA: Diagnosis not present

## 2015-06-22 DIAGNOSIS — J3089 Other allergic rhinitis: Secondary | ICD-10-CM | POA: Diagnosis not present

## 2015-07-04 DIAGNOSIS — J3081 Allergic rhinitis due to animal (cat) (dog) hair and dander: Secondary | ICD-10-CM | POA: Diagnosis not present

## 2015-07-04 DIAGNOSIS — J301 Allergic rhinitis due to pollen: Secondary | ICD-10-CM | POA: Diagnosis not present

## 2015-07-04 DIAGNOSIS — J3089 Other allergic rhinitis: Secondary | ICD-10-CM | POA: Diagnosis not present

## 2015-07-06 DIAGNOSIS — J3089 Other allergic rhinitis: Secondary | ICD-10-CM | POA: Diagnosis not present

## 2015-07-06 DIAGNOSIS — J3081 Allergic rhinitis due to animal (cat) (dog) hair and dander: Secondary | ICD-10-CM | POA: Diagnosis not present

## 2015-07-06 DIAGNOSIS — J301 Allergic rhinitis due to pollen: Secondary | ICD-10-CM | POA: Diagnosis not present

## 2015-07-10 DIAGNOSIS — N8184 Pelvic muscle wasting: Secondary | ICD-10-CM | POA: Diagnosis not present

## 2015-07-10 DIAGNOSIS — K91850 Pouchitis: Secondary | ICD-10-CM | POA: Diagnosis not present

## 2015-07-10 DIAGNOSIS — Z885 Allergy status to narcotic agent status: Secondary | ICD-10-CM | POA: Diagnosis not present

## 2015-07-10 DIAGNOSIS — R279 Unspecified lack of coordination: Secondary | ICD-10-CM | POA: Diagnosis not present

## 2015-07-10 DIAGNOSIS — R159 Full incontinence of feces: Secondary | ICD-10-CM | POA: Diagnosis not present

## 2015-07-10 DIAGNOSIS — Z91040 Latex allergy status: Secondary | ICD-10-CM | POA: Diagnosis not present

## 2015-07-10 DIAGNOSIS — Z87891 Personal history of nicotine dependence: Secondary | ICD-10-CM | POA: Diagnosis not present

## 2015-07-10 DIAGNOSIS — R195 Other fecal abnormalities: Secondary | ICD-10-CM | POA: Diagnosis not present

## 2015-07-10 DIAGNOSIS — R278 Other lack of coordination: Secondary | ICD-10-CM | POA: Diagnosis not present

## 2015-07-12 DIAGNOSIS — J3081 Allergic rhinitis due to animal (cat) (dog) hair and dander: Secondary | ICD-10-CM | POA: Diagnosis not present

## 2015-07-12 DIAGNOSIS — J301 Allergic rhinitis due to pollen: Secondary | ICD-10-CM | POA: Diagnosis not present

## 2015-07-12 DIAGNOSIS — J3089 Other allergic rhinitis: Secondary | ICD-10-CM | POA: Diagnosis not present

## 2015-07-21 DIAGNOSIS — J3089 Other allergic rhinitis: Secondary | ICD-10-CM | POA: Diagnosis not present

## 2015-07-21 DIAGNOSIS — J3081 Allergic rhinitis due to animal (cat) (dog) hair and dander: Secondary | ICD-10-CM | POA: Diagnosis not present

## 2015-07-21 DIAGNOSIS — J301 Allergic rhinitis due to pollen: Secondary | ICD-10-CM | POA: Diagnosis not present

## 2015-07-27 DIAGNOSIS — J3089 Other allergic rhinitis: Secondary | ICD-10-CM | POA: Diagnosis not present

## 2015-07-27 DIAGNOSIS — J3081 Allergic rhinitis due to animal (cat) (dog) hair and dander: Secondary | ICD-10-CM | POA: Diagnosis not present

## 2015-07-27 DIAGNOSIS — J301 Allergic rhinitis due to pollen: Secondary | ICD-10-CM | POA: Diagnosis not present

## 2015-08-03 DIAGNOSIS — J3089 Other allergic rhinitis: Secondary | ICD-10-CM | POA: Diagnosis not present

## 2015-08-03 DIAGNOSIS — J301 Allergic rhinitis due to pollen: Secondary | ICD-10-CM | POA: Diagnosis not present

## 2015-08-03 DIAGNOSIS — J3081 Allergic rhinitis due to animal (cat) (dog) hair and dander: Secondary | ICD-10-CM | POA: Diagnosis not present

## 2015-08-07 DIAGNOSIS — M5416 Radiculopathy, lumbar region: Secondary | ICD-10-CM | POA: Diagnosis not present

## 2015-08-07 DIAGNOSIS — M545 Low back pain: Secondary | ICD-10-CM | POA: Diagnosis not present

## 2015-08-07 DIAGNOSIS — G039 Meningitis, unspecified: Secondary | ICD-10-CM | POA: Diagnosis not present

## 2015-08-09 DIAGNOSIS — J3081 Allergic rhinitis due to animal (cat) (dog) hair and dander: Secondary | ICD-10-CM | POA: Diagnosis not present

## 2015-08-09 DIAGNOSIS — J301 Allergic rhinitis due to pollen: Secondary | ICD-10-CM | POA: Diagnosis not present

## 2015-08-09 DIAGNOSIS — J3089 Other allergic rhinitis: Secondary | ICD-10-CM | POA: Diagnosis not present

## 2015-08-16 DIAGNOSIS — J301 Allergic rhinitis due to pollen: Secondary | ICD-10-CM | POA: Diagnosis not present

## 2015-08-16 DIAGNOSIS — J3081 Allergic rhinitis due to animal (cat) (dog) hair and dander: Secondary | ICD-10-CM | POA: Diagnosis not present

## 2015-08-16 DIAGNOSIS — J3089 Other allergic rhinitis: Secondary | ICD-10-CM | POA: Diagnosis not present

## 2015-08-22 DIAGNOSIS — J301 Allergic rhinitis due to pollen: Secondary | ICD-10-CM | POA: Diagnosis not present

## 2015-08-22 DIAGNOSIS — J3089 Other allergic rhinitis: Secondary | ICD-10-CM | POA: Diagnosis not present

## 2015-08-22 DIAGNOSIS — J3081 Allergic rhinitis due to animal (cat) (dog) hair and dander: Secondary | ICD-10-CM | POA: Diagnosis not present

## 2015-08-22 DIAGNOSIS — Z01419 Encounter for gynecological examination (general) (routine) without abnormal findings: Secondary | ICD-10-CM | POA: Diagnosis not present

## 2015-08-22 DIAGNOSIS — Z6827 Body mass index (BMI) 27.0-27.9, adult: Secondary | ICD-10-CM | POA: Diagnosis not present

## 2015-08-22 DIAGNOSIS — Z1231 Encounter for screening mammogram for malignant neoplasm of breast: Secondary | ICD-10-CM | POA: Diagnosis not present

## 2015-08-28 DIAGNOSIS — J301 Allergic rhinitis due to pollen: Secondary | ICD-10-CM | POA: Diagnosis not present

## 2015-08-28 DIAGNOSIS — J3081 Allergic rhinitis due to animal (cat) (dog) hair and dander: Secondary | ICD-10-CM | POA: Diagnosis not present

## 2015-08-28 DIAGNOSIS — J3089 Other allergic rhinitis: Secondary | ICD-10-CM | POA: Diagnosis not present

## 2015-09-04 DIAGNOSIS — J301 Allergic rhinitis due to pollen: Secondary | ICD-10-CM | POA: Diagnosis not present

## 2015-09-04 DIAGNOSIS — G039 Meningitis, unspecified: Secondary | ICD-10-CM | POA: Diagnosis not present

## 2015-09-04 DIAGNOSIS — J3089 Other allergic rhinitis: Secondary | ICD-10-CM | POA: Diagnosis not present

## 2015-09-04 DIAGNOSIS — M545 Low back pain: Secondary | ICD-10-CM | POA: Diagnosis not present

## 2015-09-04 DIAGNOSIS — J3081 Allergic rhinitis due to animal (cat) (dog) hair and dander: Secondary | ICD-10-CM | POA: Diagnosis not present

## 2015-09-04 DIAGNOSIS — M5416 Radiculopathy, lumbar region: Secondary | ICD-10-CM | POA: Diagnosis not present

## 2015-09-14 DIAGNOSIS — J3089 Other allergic rhinitis: Secondary | ICD-10-CM | POA: Diagnosis not present

## 2015-09-14 DIAGNOSIS — J301 Allergic rhinitis due to pollen: Secondary | ICD-10-CM | POA: Diagnosis not present

## 2015-09-14 DIAGNOSIS — J3081 Allergic rhinitis due to animal (cat) (dog) hair and dander: Secondary | ICD-10-CM | POA: Diagnosis not present

## 2015-09-21 DIAGNOSIS — J3081 Allergic rhinitis due to animal (cat) (dog) hair and dander: Secondary | ICD-10-CM | POA: Diagnosis not present

## 2015-09-21 DIAGNOSIS — J3089 Other allergic rhinitis: Secondary | ICD-10-CM | POA: Diagnosis not present

## 2015-09-21 DIAGNOSIS — J301 Allergic rhinitis due to pollen: Secondary | ICD-10-CM | POA: Diagnosis not present

## 2015-09-28 DIAGNOSIS — J3081 Allergic rhinitis due to animal (cat) (dog) hair and dander: Secondary | ICD-10-CM | POA: Diagnosis not present

## 2015-09-28 DIAGNOSIS — J3089 Other allergic rhinitis: Secondary | ICD-10-CM | POA: Diagnosis not present

## 2015-09-28 DIAGNOSIS — J301 Allergic rhinitis due to pollen: Secondary | ICD-10-CM | POA: Diagnosis not present

## 2015-10-02 DIAGNOSIS — J301 Allergic rhinitis due to pollen: Secondary | ICD-10-CM | POA: Diagnosis not present

## 2015-10-02 DIAGNOSIS — J3089 Other allergic rhinitis: Secondary | ICD-10-CM | POA: Diagnosis not present

## 2015-10-02 DIAGNOSIS — J3081 Allergic rhinitis due to animal (cat) (dog) hair and dander: Secondary | ICD-10-CM | POA: Diagnosis not present

## 2015-10-11 DIAGNOSIS — J3081 Allergic rhinitis due to animal (cat) (dog) hair and dander: Secondary | ICD-10-CM | POA: Diagnosis not present

## 2015-10-11 DIAGNOSIS — J3089 Other allergic rhinitis: Secondary | ICD-10-CM | POA: Diagnosis not present

## 2015-10-11 DIAGNOSIS — J301 Allergic rhinitis due to pollen: Secondary | ICD-10-CM | POA: Diagnosis not present

## 2015-10-19 DIAGNOSIS — Z23 Encounter for immunization: Secondary | ICD-10-CM | POA: Diagnosis not present

## 2015-10-19 DIAGNOSIS — J3081 Allergic rhinitis due to animal (cat) (dog) hair and dander: Secondary | ICD-10-CM | POA: Diagnosis not present

## 2015-10-19 DIAGNOSIS — G2581 Restless legs syndrome: Secondary | ICD-10-CM | POA: Diagnosis not present

## 2015-10-19 DIAGNOSIS — F322 Major depressive disorder, single episode, severe without psychotic features: Secondary | ICD-10-CM | POA: Diagnosis not present

## 2015-10-19 DIAGNOSIS — Z0000 Encounter for general adult medical examination without abnormal findings: Secondary | ICD-10-CM | POA: Diagnosis not present

## 2015-10-19 DIAGNOSIS — J3089 Other allergic rhinitis: Secondary | ICD-10-CM | POA: Diagnosis not present

## 2015-10-19 DIAGNOSIS — R51 Headache: Secondary | ICD-10-CM | POA: Diagnosis not present

## 2015-10-19 DIAGNOSIS — J301 Allergic rhinitis due to pollen: Secondary | ICD-10-CM | POA: Diagnosis not present

## 2015-10-23 DIAGNOSIS — M5416 Radiculopathy, lumbar region: Secondary | ICD-10-CM | POA: Diagnosis not present

## 2015-10-23 DIAGNOSIS — G039 Meningitis, unspecified: Secondary | ICD-10-CM | POA: Diagnosis not present

## 2015-10-23 DIAGNOSIS — M545 Low back pain: Secondary | ICD-10-CM | POA: Diagnosis not present

## 2015-10-26 DIAGNOSIS — J301 Allergic rhinitis due to pollen: Secondary | ICD-10-CM | POA: Diagnosis not present

## 2015-10-27 DIAGNOSIS — J3089 Other allergic rhinitis: Secondary | ICD-10-CM | POA: Diagnosis not present

## 2015-10-27 DIAGNOSIS — J3081 Allergic rhinitis due to animal (cat) (dog) hair and dander: Secondary | ICD-10-CM | POA: Diagnosis not present

## 2015-10-27 DIAGNOSIS — J301 Allergic rhinitis due to pollen: Secondary | ICD-10-CM | POA: Diagnosis not present

## 2015-10-31 DIAGNOSIS — J301 Allergic rhinitis due to pollen: Secondary | ICD-10-CM | POA: Diagnosis not present

## 2015-10-31 DIAGNOSIS — J3089 Other allergic rhinitis: Secondary | ICD-10-CM | POA: Diagnosis not present

## 2015-10-31 DIAGNOSIS — J3081 Allergic rhinitis due to animal (cat) (dog) hair and dander: Secondary | ICD-10-CM | POA: Diagnosis not present

## 2015-11-09 DIAGNOSIS — J301 Allergic rhinitis due to pollen: Secondary | ICD-10-CM | POA: Diagnosis not present

## 2015-11-09 DIAGNOSIS — J3081 Allergic rhinitis due to animal (cat) (dog) hair and dander: Secondary | ICD-10-CM | POA: Diagnosis not present

## 2015-11-09 DIAGNOSIS — J3089 Other allergic rhinitis: Secondary | ICD-10-CM | POA: Diagnosis not present

## 2015-11-14 DIAGNOSIS — J3089 Other allergic rhinitis: Secondary | ICD-10-CM | POA: Diagnosis not present

## 2015-11-14 DIAGNOSIS — J301 Allergic rhinitis due to pollen: Secondary | ICD-10-CM | POA: Diagnosis not present

## 2015-11-14 DIAGNOSIS — J3081 Allergic rhinitis due to animal (cat) (dog) hair and dander: Secondary | ICD-10-CM | POA: Diagnosis not present

## 2015-11-24 DIAGNOSIS — R300 Dysuria: Secondary | ICD-10-CM | POA: Diagnosis not present

## 2015-11-24 DIAGNOSIS — G8929 Other chronic pain: Secondary | ICD-10-CM | POA: Diagnosis not present

## 2015-11-24 DIAGNOSIS — N939 Abnormal uterine and vaginal bleeding, unspecified: Secondary | ICD-10-CM | POA: Diagnosis not present

## 2015-11-24 DIAGNOSIS — M545 Low back pain: Secondary | ICD-10-CM | POA: Diagnosis not present

## 2015-11-24 DIAGNOSIS — M5416 Radiculopathy, lumbar region: Secondary | ICD-10-CM | POA: Diagnosis not present

## 2015-11-24 DIAGNOSIS — R51 Headache: Secondary | ICD-10-CM | POA: Diagnosis not present

## 2015-11-27 DIAGNOSIS — N951 Menopausal and female climacteric states: Secondary | ICD-10-CM | POA: Diagnosis not present

## 2015-11-27 DIAGNOSIS — J3081 Allergic rhinitis due to animal (cat) (dog) hair and dander: Secondary | ICD-10-CM | POA: Diagnosis not present

## 2015-11-27 DIAGNOSIS — J3089 Other allergic rhinitis: Secondary | ICD-10-CM | POA: Diagnosis not present

## 2015-11-27 DIAGNOSIS — J301 Allergic rhinitis due to pollen: Secondary | ICD-10-CM | POA: Diagnosis not present

## 2015-11-27 DIAGNOSIS — N950 Postmenopausal bleeding: Secondary | ICD-10-CM | POA: Diagnosis not present

## 2015-12-06 DIAGNOSIS — J301 Allergic rhinitis due to pollen: Secondary | ICD-10-CM | POA: Diagnosis not present

## 2015-12-06 DIAGNOSIS — J3089 Other allergic rhinitis: Secondary | ICD-10-CM | POA: Diagnosis not present

## 2015-12-06 DIAGNOSIS — J3081 Allergic rhinitis due to animal (cat) (dog) hair and dander: Secondary | ICD-10-CM | POA: Diagnosis not present

## 2015-12-08 DIAGNOSIS — J301 Allergic rhinitis due to pollen: Secondary | ICD-10-CM | POA: Diagnosis not present

## 2015-12-08 DIAGNOSIS — J3089 Other allergic rhinitis: Secondary | ICD-10-CM | POA: Diagnosis not present

## 2015-12-08 DIAGNOSIS — J3081 Allergic rhinitis due to animal (cat) (dog) hair and dander: Secondary | ICD-10-CM | POA: Diagnosis not present

## 2015-12-20 DIAGNOSIS — J3089 Other allergic rhinitis: Secondary | ICD-10-CM | POA: Diagnosis not present

## 2015-12-20 DIAGNOSIS — J3081 Allergic rhinitis due to animal (cat) (dog) hair and dander: Secondary | ICD-10-CM | POA: Diagnosis not present

## 2015-12-20 DIAGNOSIS — J301 Allergic rhinitis due to pollen: Secondary | ICD-10-CM | POA: Diagnosis not present

## 2015-12-27 DIAGNOSIS — J3089 Other allergic rhinitis: Secondary | ICD-10-CM | POA: Diagnosis not present

## 2015-12-27 DIAGNOSIS — J3081 Allergic rhinitis due to animal (cat) (dog) hair and dander: Secondary | ICD-10-CM | POA: Diagnosis not present

## 2015-12-27 DIAGNOSIS — J301 Allergic rhinitis due to pollen: Secondary | ICD-10-CM | POA: Diagnosis not present

## 2016-01-03 DIAGNOSIS — J3089 Other allergic rhinitis: Secondary | ICD-10-CM | POA: Diagnosis not present

## 2016-01-03 DIAGNOSIS — J3081 Allergic rhinitis due to animal (cat) (dog) hair and dander: Secondary | ICD-10-CM | POA: Diagnosis not present

## 2016-01-03 DIAGNOSIS — J301 Allergic rhinitis due to pollen: Secondary | ICD-10-CM | POA: Diagnosis not present

## 2016-01-09 DIAGNOSIS — J3081 Allergic rhinitis due to animal (cat) (dog) hair and dander: Secondary | ICD-10-CM | POA: Diagnosis not present

## 2016-01-09 DIAGNOSIS — J3089 Other allergic rhinitis: Secondary | ICD-10-CM | POA: Diagnosis not present

## 2016-01-09 DIAGNOSIS — J301 Allergic rhinitis due to pollen: Secondary | ICD-10-CM | POA: Diagnosis not present

## 2016-01-17 DIAGNOSIS — L819 Disorder of pigmentation, unspecified: Secondary | ICD-10-CM | POA: Diagnosis not present

## 2016-01-17 DIAGNOSIS — L821 Other seborrheic keratosis: Secondary | ICD-10-CM | POA: Diagnosis not present

## 2016-01-17 DIAGNOSIS — J3089 Other allergic rhinitis: Secondary | ICD-10-CM | POA: Diagnosis not present

## 2016-01-17 DIAGNOSIS — J301 Allergic rhinitis due to pollen: Secondary | ICD-10-CM | POA: Diagnosis not present

## 2016-01-17 DIAGNOSIS — J3081 Allergic rhinitis due to animal (cat) (dog) hair and dander: Secondary | ICD-10-CM | POA: Diagnosis not present

## 2016-01-24 DIAGNOSIS — J301 Allergic rhinitis due to pollen: Secondary | ICD-10-CM | POA: Diagnosis not present

## 2016-01-24 DIAGNOSIS — K5190 Ulcerative colitis, unspecified, without complications: Secondary | ICD-10-CM | POA: Diagnosis not present

## 2016-01-24 DIAGNOSIS — K91850 Pouchitis: Secondary | ICD-10-CM | POA: Diagnosis not present

## 2016-01-24 DIAGNOSIS — J3081 Allergic rhinitis due to animal (cat) (dog) hair and dander: Secondary | ICD-10-CM | POA: Diagnosis not present

## 2016-01-24 DIAGNOSIS — Z6826 Body mass index (BMI) 26.0-26.9, adult: Secondary | ICD-10-CM | POA: Diagnosis not present

## 2016-01-24 DIAGNOSIS — Z792 Long term (current) use of antibiotics: Secondary | ICD-10-CM | POA: Diagnosis not present

## 2016-01-24 DIAGNOSIS — Z980 Intestinal bypass and anastomosis status: Secondary | ICD-10-CM | POA: Diagnosis not present

## 2016-01-24 DIAGNOSIS — Z9049 Acquired absence of other specified parts of digestive tract: Secondary | ICD-10-CM | POA: Diagnosis not present

## 2016-01-24 DIAGNOSIS — J3089 Other allergic rhinitis: Secondary | ICD-10-CM | POA: Diagnosis not present

## 2016-01-24 DIAGNOSIS — K591 Functional diarrhea: Secondary | ICD-10-CM | POA: Diagnosis not present

## 2016-02-06 DIAGNOSIS — J3089 Other allergic rhinitis: Secondary | ICD-10-CM | POA: Diagnosis not present

## 2016-02-06 DIAGNOSIS — J3081 Allergic rhinitis due to animal (cat) (dog) hair and dander: Secondary | ICD-10-CM | POA: Diagnosis not present

## 2016-02-06 DIAGNOSIS — J301 Allergic rhinitis due to pollen: Secondary | ICD-10-CM | POA: Diagnosis not present

## 2016-02-13 DIAGNOSIS — M5416 Radiculopathy, lumbar region: Secondary | ICD-10-CM | POA: Diagnosis not present

## 2016-02-16 DIAGNOSIS — J3089 Other allergic rhinitis: Secondary | ICD-10-CM | POA: Diagnosis not present

## 2016-02-16 DIAGNOSIS — J301 Allergic rhinitis due to pollen: Secondary | ICD-10-CM | POA: Diagnosis not present

## 2016-02-16 DIAGNOSIS — J3081 Allergic rhinitis due to animal (cat) (dog) hair and dander: Secondary | ICD-10-CM | POA: Diagnosis not present

## 2016-02-19 DIAGNOSIS — J301 Allergic rhinitis due to pollen: Secondary | ICD-10-CM | POA: Diagnosis not present

## 2016-02-19 DIAGNOSIS — J3089 Other allergic rhinitis: Secondary | ICD-10-CM | POA: Diagnosis not present

## 2016-02-19 DIAGNOSIS — J3081 Allergic rhinitis due to animal (cat) (dog) hair and dander: Secondary | ICD-10-CM | POA: Diagnosis not present

## 2016-02-27 DIAGNOSIS — J301 Allergic rhinitis due to pollen: Secondary | ICD-10-CM | POA: Diagnosis not present

## 2016-02-27 DIAGNOSIS — J3089 Other allergic rhinitis: Secondary | ICD-10-CM | POA: Diagnosis not present

## 2016-02-27 DIAGNOSIS — J3081 Allergic rhinitis due to animal (cat) (dog) hair and dander: Secondary | ICD-10-CM | POA: Diagnosis not present

## 2016-03-07 DIAGNOSIS — J301 Allergic rhinitis due to pollen: Secondary | ICD-10-CM | POA: Diagnosis not present

## 2016-03-07 DIAGNOSIS — J3081 Allergic rhinitis due to animal (cat) (dog) hair and dander: Secondary | ICD-10-CM | POA: Diagnosis not present

## 2016-03-07 DIAGNOSIS — J3089 Other allergic rhinitis: Secondary | ICD-10-CM | POA: Diagnosis not present

## 2016-03-11 DIAGNOSIS — J3089 Other allergic rhinitis: Secondary | ICD-10-CM | POA: Diagnosis not present

## 2016-03-11 DIAGNOSIS — J301 Allergic rhinitis due to pollen: Secondary | ICD-10-CM | POA: Diagnosis not present

## 2016-03-11 DIAGNOSIS — J3081 Allergic rhinitis due to animal (cat) (dog) hair and dander: Secondary | ICD-10-CM | POA: Diagnosis not present

## 2016-03-13 DIAGNOSIS — Z09 Encounter for follow-up examination after completed treatment for conditions other than malignant neoplasm: Secondary | ICD-10-CM | POA: Diagnosis not present

## 2016-03-13 DIAGNOSIS — K6289 Other specified diseases of anus and rectum: Secondary | ICD-10-CM | POA: Diagnosis not present

## 2016-03-13 DIAGNOSIS — Z9049 Acquired absence of other specified parts of digestive tract: Secondary | ICD-10-CM | POA: Diagnosis not present

## 2016-03-13 DIAGNOSIS — Z1211 Encounter for screening for malignant neoplasm of colon: Secondary | ICD-10-CM | POA: Diagnosis not present

## 2016-03-13 DIAGNOSIS — K51211 Ulcerative (chronic) proctitis with rectal bleeding: Secondary | ICD-10-CM | POA: Diagnosis not present

## 2016-03-13 DIAGNOSIS — K529 Noninfective gastroenteritis and colitis, unspecified: Secondary | ICD-10-CM | POA: Diagnosis not present

## 2016-03-13 DIAGNOSIS — Z980 Intestinal bypass and anastomosis status: Secondary | ICD-10-CM | POA: Diagnosis not present

## 2016-03-15 DIAGNOSIS — Z6826 Body mass index (BMI) 26.0-26.9, adult: Secondary | ICD-10-CM | POA: Diagnosis not present

## 2016-03-15 DIAGNOSIS — J069 Acute upper respiratory infection, unspecified: Secondary | ICD-10-CM | POA: Diagnosis not present

## 2016-03-15 DIAGNOSIS — R030 Elevated blood-pressure reading, without diagnosis of hypertension: Secondary | ICD-10-CM | POA: Diagnosis not present

## 2016-03-15 DIAGNOSIS — H6980 Other specified disorders of Eustachian tube, unspecified ear: Secondary | ICD-10-CM | POA: Diagnosis not present

## 2016-03-15 DIAGNOSIS — M5416 Radiculopathy, lumbar region: Secondary | ICD-10-CM | POA: Diagnosis not present

## 2016-03-21 DIAGNOSIS — J3081 Allergic rhinitis due to animal (cat) (dog) hair and dander: Secondary | ICD-10-CM | POA: Diagnosis not present

## 2016-03-21 DIAGNOSIS — J3089 Other allergic rhinitis: Secondary | ICD-10-CM | POA: Diagnosis not present

## 2016-03-21 DIAGNOSIS — J301 Allergic rhinitis due to pollen: Secondary | ICD-10-CM | POA: Diagnosis not present

## 2016-03-27 DIAGNOSIS — J301 Allergic rhinitis due to pollen: Secondary | ICD-10-CM | POA: Diagnosis not present

## 2016-03-27 DIAGNOSIS — J3081 Allergic rhinitis due to animal (cat) (dog) hair and dander: Secondary | ICD-10-CM | POA: Diagnosis not present

## 2016-03-27 DIAGNOSIS — J3089 Other allergic rhinitis: Secondary | ICD-10-CM | POA: Diagnosis not present

## 2016-05-10 DIAGNOSIS — J3081 Allergic rhinitis due to animal (cat) (dog) hair and dander: Secondary | ICD-10-CM | POA: Diagnosis not present

## 2016-05-10 DIAGNOSIS — J301 Allergic rhinitis due to pollen: Secondary | ICD-10-CM | POA: Diagnosis not present

## 2016-05-10 DIAGNOSIS — J3089 Other allergic rhinitis: Secondary | ICD-10-CM | POA: Diagnosis not present

## 2016-05-15 DIAGNOSIS — R05 Cough: Secondary | ICD-10-CM | POA: Diagnosis not present

## 2016-05-15 DIAGNOSIS — J069 Acute upper respiratory infection, unspecified: Secondary | ICD-10-CM | POA: Diagnosis not present

## 2016-05-17 DIAGNOSIS — J3089 Other allergic rhinitis: Secondary | ICD-10-CM | POA: Diagnosis not present

## 2016-05-17 DIAGNOSIS — J301 Allergic rhinitis due to pollen: Secondary | ICD-10-CM | POA: Diagnosis not present

## 2016-05-17 DIAGNOSIS — J3081 Allergic rhinitis due to animal (cat) (dog) hair and dander: Secondary | ICD-10-CM | POA: Diagnosis not present

## 2016-05-20 DIAGNOSIS — M5416 Radiculopathy, lumbar region: Secondary | ICD-10-CM | POA: Diagnosis not present

## 2016-05-21 DIAGNOSIS — J3089 Other allergic rhinitis: Secondary | ICD-10-CM | POA: Diagnosis not present

## 2016-05-21 DIAGNOSIS — J3081 Allergic rhinitis due to animal (cat) (dog) hair and dander: Secondary | ICD-10-CM | POA: Diagnosis not present

## 2016-05-21 DIAGNOSIS — J301 Allergic rhinitis due to pollen: Secondary | ICD-10-CM | POA: Diagnosis not present

## 2016-05-23 DIAGNOSIS — J301 Allergic rhinitis due to pollen: Secondary | ICD-10-CM | POA: Diagnosis not present

## 2016-05-23 DIAGNOSIS — J3089 Other allergic rhinitis: Secondary | ICD-10-CM | POA: Diagnosis not present

## 2016-05-23 DIAGNOSIS — J3081 Allergic rhinitis due to animal (cat) (dog) hair and dander: Secondary | ICD-10-CM | POA: Diagnosis not present

## 2016-05-29 DIAGNOSIS — J301 Allergic rhinitis due to pollen: Secondary | ICD-10-CM | POA: Diagnosis not present

## 2016-05-29 DIAGNOSIS — J3081 Allergic rhinitis due to animal (cat) (dog) hair and dander: Secondary | ICD-10-CM | POA: Diagnosis not present

## 2016-05-29 DIAGNOSIS — J3089 Other allergic rhinitis: Secondary | ICD-10-CM | POA: Diagnosis not present

## 2016-05-30 ENCOUNTER — Ambulatory Visit
Admission: RE | Admit: 2016-05-30 | Discharge: 2016-05-30 | Disposition: A | Discharge status: Home / Self Care | Payer: BLUE CROSS/BLUE SHIELD | Source: Ambulatory Visit | Attending: Physician Assistant | Admitting: Physician Assistant

## 2016-05-30 ENCOUNTER — Other Ambulatory Visit: Payer: Self-pay | Admitting: Physician Assistant

## 2016-05-30 DIAGNOSIS — R05 Cough: Secondary | ICD-10-CM

## 2016-05-30 DIAGNOSIS — R0602 Shortness of breath: Secondary | ICD-10-CM | POA: Diagnosis not present

## 2016-05-30 DIAGNOSIS — R059 Cough, unspecified: Secondary | ICD-10-CM

## 2016-05-31 DIAGNOSIS — J301 Allergic rhinitis due to pollen: Secondary | ICD-10-CM | POA: Diagnosis not present

## 2016-05-31 DIAGNOSIS — J3089 Other allergic rhinitis: Secondary | ICD-10-CM | POA: Diagnosis not present

## 2016-05-31 DIAGNOSIS — J3081 Allergic rhinitis due to animal (cat) (dog) hair and dander: Secondary | ICD-10-CM | POA: Diagnosis not present

## 2016-06-06 DIAGNOSIS — J3081 Allergic rhinitis due to animal (cat) (dog) hair and dander: Secondary | ICD-10-CM | POA: Diagnosis not present

## 2016-06-06 DIAGNOSIS — J3089 Other allergic rhinitis: Secondary | ICD-10-CM | POA: Diagnosis not present

## 2016-06-06 DIAGNOSIS — J301 Allergic rhinitis due to pollen: Secondary | ICD-10-CM | POA: Diagnosis not present

## 2016-06-11 DIAGNOSIS — J301 Allergic rhinitis due to pollen: Secondary | ICD-10-CM | POA: Diagnosis not present

## 2016-06-11 DIAGNOSIS — J3081 Allergic rhinitis due to animal (cat) (dog) hair and dander: Secondary | ICD-10-CM | POA: Diagnosis not present

## 2016-06-11 DIAGNOSIS — J3089 Other allergic rhinitis: Secondary | ICD-10-CM | POA: Diagnosis not present

## 2016-06-12 DIAGNOSIS — M545 Low back pain: Secondary | ICD-10-CM | POA: Diagnosis not present

## 2016-06-12 DIAGNOSIS — M5416 Radiculopathy, lumbar region: Secondary | ICD-10-CM | POA: Diagnosis not present

## 2016-06-12 DIAGNOSIS — Z6826 Body mass index (BMI) 26.0-26.9, adult: Secondary | ICD-10-CM | POA: Diagnosis not present

## 2016-06-24 DIAGNOSIS — R05 Cough: Secondary | ICD-10-CM | POA: Diagnosis not present

## 2016-06-26 DIAGNOSIS — J301 Allergic rhinitis due to pollen: Secondary | ICD-10-CM | POA: Diagnosis not present

## 2016-06-26 DIAGNOSIS — J3089 Other allergic rhinitis: Secondary | ICD-10-CM | POA: Diagnosis not present

## 2016-06-26 DIAGNOSIS — J3081 Allergic rhinitis due to animal (cat) (dog) hair and dander: Secondary | ICD-10-CM | POA: Diagnosis not present

## 2016-07-02 DIAGNOSIS — Z885 Allergy status to narcotic agent status: Secondary | ICD-10-CM | POA: Diagnosis not present

## 2016-07-02 DIAGNOSIS — Z9049 Acquired absence of other specified parts of digestive tract: Secondary | ICD-10-CM | POA: Diagnosis not present

## 2016-07-02 DIAGNOSIS — Z91040 Latex allergy status: Secondary | ICD-10-CM | POA: Diagnosis not present

## 2016-07-02 DIAGNOSIS — R159 Full incontinence of feces: Secondary | ICD-10-CM | POA: Diagnosis not present

## 2016-07-02 DIAGNOSIS — K5190 Ulcerative colitis, unspecified, without complications: Secondary | ICD-10-CM | POA: Diagnosis not present

## 2016-07-05 DIAGNOSIS — J301 Allergic rhinitis due to pollen: Secondary | ICD-10-CM | POA: Diagnosis not present

## 2016-07-05 DIAGNOSIS — J3089 Other allergic rhinitis: Secondary | ICD-10-CM | POA: Diagnosis not present

## 2016-07-05 DIAGNOSIS — J3081 Allergic rhinitis due to animal (cat) (dog) hair and dander: Secondary | ICD-10-CM | POA: Diagnosis not present

## 2016-07-07 DIAGNOSIS — R062 Wheezing: Secondary | ICD-10-CM | POA: Diagnosis not present

## 2016-07-11 DIAGNOSIS — J3089 Other allergic rhinitis: Secondary | ICD-10-CM | POA: Diagnosis not present

## 2016-07-11 DIAGNOSIS — J3081 Allergic rhinitis due to animal (cat) (dog) hair and dander: Secondary | ICD-10-CM | POA: Diagnosis not present

## 2016-07-11 DIAGNOSIS — J301 Allergic rhinitis due to pollen: Secondary | ICD-10-CM | POA: Diagnosis not present

## 2016-07-15 DIAGNOSIS — J301 Allergic rhinitis due to pollen: Secondary | ICD-10-CM | POA: Diagnosis not present

## 2016-07-15 DIAGNOSIS — J3089 Other allergic rhinitis: Secondary | ICD-10-CM | POA: Diagnosis not present

## 2016-07-15 DIAGNOSIS — J3081 Allergic rhinitis due to animal (cat) (dog) hair and dander: Secondary | ICD-10-CM | POA: Diagnosis not present

## 2016-07-16 DIAGNOSIS — Z933 Colostomy status: Secondary | ICD-10-CM | POA: Diagnosis not present

## 2016-07-16 DIAGNOSIS — R159 Full incontinence of feces: Secondary | ICD-10-CM | POA: Diagnosis not present

## 2016-07-16 DIAGNOSIS — Z79899 Other long term (current) drug therapy: Secondary | ICD-10-CM | POA: Diagnosis not present

## 2016-07-16 DIAGNOSIS — Z87891 Personal history of nicotine dependence: Secondary | ICD-10-CM | POA: Diagnosis not present

## 2016-07-16 DIAGNOSIS — Z885 Allergy status to narcotic agent status: Secondary | ICD-10-CM | POA: Diagnosis not present

## 2016-07-16 DIAGNOSIS — Z91040 Latex allergy status: Secondary | ICD-10-CM | POA: Diagnosis not present

## 2016-07-24 DIAGNOSIS — J3089 Other allergic rhinitis: Secondary | ICD-10-CM | POA: Diagnosis not present

## 2016-07-24 DIAGNOSIS — J3081 Allergic rhinitis due to animal (cat) (dog) hair and dander: Secondary | ICD-10-CM | POA: Diagnosis not present

## 2016-07-24 DIAGNOSIS — J301 Allergic rhinitis due to pollen: Secondary | ICD-10-CM | POA: Diagnosis not present

## 2016-07-29 DIAGNOSIS — Z09 Encounter for follow-up examination after completed treatment for conditions other than malignant neoplasm: Secondary | ICD-10-CM | POA: Diagnosis not present

## 2016-07-31 DIAGNOSIS — J301 Allergic rhinitis due to pollen: Secondary | ICD-10-CM | POA: Diagnosis not present

## 2016-07-31 DIAGNOSIS — J3089 Other allergic rhinitis: Secondary | ICD-10-CM | POA: Diagnosis not present

## 2016-07-31 DIAGNOSIS — J3081 Allergic rhinitis due to animal (cat) (dog) hair and dander: Secondary | ICD-10-CM | POA: Diagnosis not present

## 2016-08-06 DIAGNOSIS — J3081 Allergic rhinitis due to animal (cat) (dog) hair and dander: Secondary | ICD-10-CM | POA: Diagnosis not present

## 2016-08-06 DIAGNOSIS — J301 Allergic rhinitis due to pollen: Secondary | ICD-10-CM | POA: Diagnosis not present

## 2016-08-06 DIAGNOSIS — J3089 Other allergic rhinitis: Secondary | ICD-10-CM | POA: Diagnosis not present

## 2016-08-13 DIAGNOSIS — M5416 Radiculopathy, lumbar region: Secondary | ICD-10-CM | POA: Diagnosis not present

## 2016-08-14 DIAGNOSIS — J3089 Other allergic rhinitis: Secondary | ICD-10-CM | POA: Diagnosis not present

## 2016-08-14 DIAGNOSIS — J209 Acute bronchitis, unspecified: Secondary | ICD-10-CM | POA: Diagnosis not present

## 2016-08-14 DIAGNOSIS — J301 Allergic rhinitis due to pollen: Secondary | ICD-10-CM | POA: Diagnosis not present

## 2016-08-14 DIAGNOSIS — R05 Cough: Secondary | ICD-10-CM | POA: Diagnosis not present

## 2016-08-21 DIAGNOSIS — J3089 Other allergic rhinitis: Secondary | ICD-10-CM | POA: Diagnosis not present

## 2016-08-21 DIAGNOSIS — J3081 Allergic rhinitis due to animal (cat) (dog) hair and dander: Secondary | ICD-10-CM | POA: Diagnosis not present

## 2016-08-21 DIAGNOSIS — J301 Allergic rhinitis due to pollen: Secondary | ICD-10-CM | POA: Diagnosis not present

## 2016-08-27 DIAGNOSIS — J3081 Allergic rhinitis due to animal (cat) (dog) hair and dander: Secondary | ICD-10-CM | POA: Diagnosis not present

## 2016-08-27 DIAGNOSIS — J301 Allergic rhinitis due to pollen: Secondary | ICD-10-CM | POA: Diagnosis not present

## 2016-08-27 DIAGNOSIS — J3089 Other allergic rhinitis: Secondary | ICD-10-CM | POA: Diagnosis not present

## 2016-09-02 DIAGNOSIS — S96911A Strain of unspecified muscle and tendon at ankle and foot level, right foot, initial encounter: Secondary | ICD-10-CM | POA: Diagnosis not present

## 2016-09-02 DIAGNOSIS — S63502A Unspecified sprain of left wrist, initial encounter: Secondary | ICD-10-CM | POA: Diagnosis not present

## 2016-09-03 DIAGNOSIS — J301 Allergic rhinitis due to pollen: Secondary | ICD-10-CM | POA: Diagnosis not present

## 2016-09-03 DIAGNOSIS — J3089 Other allergic rhinitis: Secondary | ICD-10-CM | POA: Diagnosis not present

## 2016-09-03 DIAGNOSIS — J3081 Allergic rhinitis due to animal (cat) (dog) hair and dander: Secondary | ICD-10-CM | POA: Diagnosis not present

## 2016-09-04 ENCOUNTER — Emergency Department (HOSPITAL_COMMUNITY)
Admission: EM | Admit: 2016-09-04 | Discharge: 2016-09-05 | Disposition: A | Discharge status: Home / Self Care | Payer: BLUE CROSS/BLUE SHIELD | Attending: Emergency Medicine | Admitting: Emergency Medicine

## 2016-09-04 ENCOUNTER — Encounter (HOSPITAL_COMMUNITY): Payer: Self-pay | Admitting: *Deleted

## 2016-09-04 DIAGNOSIS — Z91040 Latex allergy status: Secondary | ICD-10-CM | POA: Insufficient documentation

## 2016-09-04 DIAGNOSIS — R109 Unspecified abdominal pain: Secondary | ICD-10-CM | POA: Diagnosis not present

## 2016-09-04 DIAGNOSIS — R1084 Generalized abdominal pain: Secondary | ICD-10-CM | POA: Diagnosis not present

## 2016-09-04 HISTORY — DX: Ulcerative colitis, unspecified, without complications: K51.90

## 2016-09-04 LAB — COMPREHENSIVE METABOLIC PANEL
ALBUMIN: 3.4 g/dL — AB (ref 3.5–5.0)
ALK PHOS: 98 U/L (ref 38–126)
ALT: 43 U/L (ref 14–54)
ANION GAP: 8 (ref 5–15)
AST: 36 U/L (ref 15–41)
BUN: 20 mg/dL (ref 6–20)
CALCIUM: 8.9 mg/dL (ref 8.9–10.3)
CO2: 25 mmol/L (ref 22–32)
Chloride: 106 mmol/L (ref 101–111)
Creatinine, Ser: 1.03 mg/dL — ABNORMAL HIGH (ref 0.44–1.00)
GFR calc Af Amer: >60 mL/min (ref >60)
GFR calc non Af Amer: >60 mL/min (ref >60)
GLUCOSE: 111 mg/dL — AB (ref 65–99)
Potassium: 4.2 mmol/L (ref 3.5–5.1)
SODIUM: 139 mmol/L (ref 135–145)
Total Bilirubin: 0.6 mg/dL (ref 0.3–1.2)
Total Protein: 5.9 g/dL — ABNORMAL LOW (ref 6.5–8.1)

## 2016-09-04 LAB — CBC
HEMATOCRIT: 38.7 % (ref 36.0–46.0)
HEMOGLOBIN: 12.6 g/dL (ref 12.0–15.0)
MCH: 28.7 pg (ref 26.0–34.0)
MCHC: 32.6 g/dL (ref 30.0–36.0)
MCV: 88.2 fL (ref 78.0–100.0)
Platelets: 251 10*3/uL (ref 150–400)
RBC: 4.39 MIL/uL (ref 3.87–5.11)
RDW: 15.9 % — ABNORMAL HIGH (ref 11.5–15.5)
WBC: 8.1 10*3/uL (ref 4.0–10.5)

## 2016-09-04 LAB — URINALYSIS, ROUTINE W REFLEX MICROSCOPIC
BACTERIA UA: NONE SEEN
Bilirubin Urine: NEGATIVE
Glucose, UA: NEGATIVE mg/dL
Hgb urine dipstick: NEGATIVE
KETONES UR: NEGATIVE mg/dL
Leukocytes, UA: NEGATIVE
Nitrite: NEGATIVE
PROTEIN: 30 mg/dL — AB
Specific Gravity, Urine: 1.038 — ABNORMAL HIGH (ref 1.005–1.030)
pH: 5 (ref 5.0–8.0)

## 2016-09-04 LAB — LIPASE, BLOOD: Lipase: 41 U/L (ref 11–51)

## 2016-09-04 MED ORDER — ONDANSETRON 4 MG PO TBDP
4.0000 mg | ORAL_TABLET | Freq: Once | ORAL | Status: AC | PRN
  Administered 2016-09-04: 4 mg via ORAL

## 2016-09-04 MED ORDER — ONDANSETRON 4 MG PO TBDP
ORAL_TABLET | ORAL | Status: AC
  Filled 2016-09-04: qty 1

## 2016-09-04 NOTE — ED Triage Notes (Signed)
Pt c/o upper abdominal pain onset 45 mins ago with nausea. 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

## 2016-09-05 ENCOUNTER — Emergency Department (HOSPITAL_COMMUNITY): Payer: BLUE CROSS/BLUE SHIELD

## 2016-09-05 DIAGNOSIS — R109 Unspecified abdominal pain: Secondary | ICD-10-CM | POA: Diagnosis not present

## 2016-09-05 LAB — PREGNANCY, URINE: Preg Test, Ur: NEGATIVE

## 2016-09-05 MED ORDER — HYDROCODONE-ACETAMINOPHEN 5-325 MG PO TABS: 1.0000 | ORAL_TABLET | Freq: Four times a day (QID) | ORAL | 0 refills | Status: DC | PRN

## 2016-09-05 MED ORDER — IOPAMIDOL (ISOVUE-300) INJECTION 61%
INTRAVENOUS | Status: AC
  Filled 2016-09-05: qty 30

## 2016-09-05 MED ORDER — ONDANSETRON HCL 4 MG/2ML IJ SOLN
4.0000 mg | Freq: Once | INTRAMUSCULAR | Status: AC
  Administered 2016-09-05: 4 mg via INTRAVENOUS
  Filled 2016-09-05: qty 2

## 2016-09-05 MED ORDER — HYDROMORPHONE HCL 1 MG/ML IJ SOLN
1.0000 mg | Freq: Once | INTRAMUSCULAR | Status: AC | PRN
  Administered 2016-09-05: 1 mg via INTRAVENOUS
  Filled 2016-09-05: qty 1

## 2016-09-05 MED ORDER — ONDANSETRON HCL 4 MG PO TABS: 4.0000 mg | ORAL_TABLET | Freq: Four times a day (QID) | ORAL | 0 refills | Status: DC

## 2016-09-05 MED ORDER — DIPHENHYDRAMINE HCL 50 MG/ML IJ SOLN
25.0000 mg | Freq: Once | INTRAMUSCULAR | Status: AC
  Administered 2016-09-05: 25 mg via INTRAVENOUS
  Filled 2016-09-05: qty 1

## 2016-09-05 MED ORDER — HYDROMORPHONE HCL 1 MG/ML IJ SOLN
1.0000 mg | Freq: Once | INTRAMUSCULAR | Status: AC
  Administered 2016-09-05: 1 mg via INTRAVENOUS
  Filled 2016-09-05: qty 1

## 2016-09-05 MED ORDER — IOPAMIDOL (ISOVUE-300) INJECTION 61%
INTRAVENOUS | Status: AC
  Administered 2016-09-05: 100 mL
  Filled 2016-09-05: qty 100

## 2016-09-05 NOTE — ED Notes (Signed)
Patient transported to CT 

## 2016-09-05 NOTE — ED Provider Notes (Signed)
[facility] DEPT Provider Note   CSN: [id number] Arrival date & time: 09/04/16  2008     History   Chief Complaint Chief Complaint  Patient presents with  . Abdominal Pain    HPI [name] is a 56 y.o. female.  Patient presents to the ED with a chief complaint of abdominal pain.  She states that the symptoms started suddenly last night after dinner.  She reports associated nausea and vomiting.  She reports that she normally has diarrhea due to her prior colectomy.  She denies any fevers or chills.  She denies sick contacts.  She went out to dinner last night and shared a meal with her husband, who did not get sick.  She denies any other associated symptoms.   The history is provided by the patient. No language interpreter was used.    Past Medical History:  Diagnosis Date  . Ulcerative colitis ([place])     There are no active problems to display for this patient.   Past Surgical History:  Procedure Laterality Date  . COLOPROCTECTOMY W/ ILEO J POUCH    . PACEMAKER IMPLANT      OB History    No data available       Home Medications    Prior to Admission medications   Not on File    Family History No family history on file.  Social History Social History  Substance Use Topics  . Smoking status: Never Smoker  . Smokeless tobacco: Never Used  . Alcohol use Yes     Allergies   Latex and Morphine and related   Review of Systems Review of Systems  All other systems reviewed and are negative.    Physical Exam Updated Vital Signs BP (!) 120/48 (BP Location: Left Arm)   Pulse 73   Temp 97.9 F (36.6 C) (Oral)   Resp 16   SpO2 98%   Physical Exam  Constitutional: She is oriented to person, place, and time. She appears well-developed and well-nourished.  HENT:  Head: Normocephalic and atraumatic.  Eyes: Pupils are equal, round, and reactive to light. Conjunctivae and EOM are normal.  Neck: Normal range of motion. Neck supple.    Cardiovascular: Normal rate and regular rhythm.  Exam reveals no gallop and no friction rub.   No murmur heard. Pulmonary/Chest: Effort normal and breath sounds normal. No respiratory distress. She has no wheezes. She has no rales. She exhibits no tenderness.  Abdominal: Soft. Bowel sounds are normal. She exhibits no distension and no mass. There is tenderness. There is no rebound and no guarding.  Musculoskeletal: Normal range of motion. She exhibits no edema or tenderness.  Neurological: She is alert and oriented to person, place, and time.  Skin: Skin is warm and dry.  Psychiatric: She has a normal mood and affect. Her behavior is normal. Judgment and thought content normal.  Nursing note and vitals reviewed.    ED Treatments / Results  Labs (all labs ordered are listed, but only abnormal results are displayed) Labs Reviewed  COMPREHENSIVE METABOLIC PANEL - Abnormal; Notable for the following:       Result Value   Glucose, Bld 111 (*)    Creatinine, Ser 1.03 (*)    Total Protein 5.9 (*)    Albumin 3.4 (*)    All other components within normal limits  CBC - Abnormal; Notable for the following:    RDW 15.9 (*)    All other components within normal limits  URINALYSIS, ROUTINE  W REFLEX MICROSCOPIC - Abnormal; Notable for the following:    APPearance HAZY (*)    Specific Gravity, Urine 1.038 (*)    Protein, ur 30 (*)    Squamous Epithelial / LPF 0-5 (*)    All other components within normal limits  LIPASE, BLOOD  PREGNANCY, URINE    EKG  EKG Interpretation None       Radiology Ct Abdomen Pelvis W Contrast  Result Date: 09/05/2016 CLINICAL DATA:  Initial evaluation for acute diffuse abdominal pain. History of ulcerative colitis. EXAM: CT ABDOMEN AND PELVIS WITH CONTRAST TECHNIQUE: Multidetector CT imaging of the abdomen and pelvis was performed using the standard protocol following bolus administration of intravenous contrast. CONTRAST:  141m ISOVUE-300 IOPAMIDOL  (ISOVUE-300) INJECTION 61% COMPARISON:  None available. FINDINGS: Lower chest: Visualized lung bases are clear. Hepatobiliary: Liver demonstrates a normal contrast enhanced appearance. Gallbladder within normal limits. No biliary dilatation. Pancreas: Pancreas within normal limits. Spleen: Spleen within normal limits. Adrenals/Urinary Tract: Adrenal glands are normal. Kidneys equal in size with symmetric enhancement. No nephrolithiasis, hydronephrosis, or focal enhancing renal mass. No hydroureter. Bladder within normal limits. Stomach/Bowel: Stomach within normal limits. No evidence for bowel obstruction. Postoperative changes from previous coloproctectomy with ileal J-pouch. No abnormal wall thickening, mucosal enhancement, or inflammatory fat stranding seen about the bowels. Vascular/Lymphatic: Normal intravascular enhancement seen throughout the intra-abdominal aorta and its branch vessels. No adenopathy. Reproductive: Uterus and ovaries within normal limits. Other: No free air or fluid. Musculoskeletal: Sequelae of prior fusion noted at L4-5. Stimulator device present within the left flank. No acute osseus abnormality. No worrisome lytic or blastic osseous lesions. IMPRESSION: 1. No CT evidence for acute intra-abdominal or pelvic process. 2. Postoperative changes from prior coloproctectomy with ileal J-pouch formation. No complication. Electronically Signed   By: BJeannine BogaM.D.   On: 09/05/2016 04:08    Procedures Procedures (including critical care time)  Medications Ordered in ED Medications  ondansetron (ZOFRAN-ODT) 4 MG disintegrating tablet (not administered)  HYDROmorphone (DILAUDID) injection 1 mg (not administered)  ondansetron (ZOFRAN) injection 4 mg (not administered)  ondansetron (ZOFRAN-ODT) disintegrating tablet 4 mg (4 mg Oral Given 09/04/16 2024)     Initial Impression / Assessment and Plan / ED Course  I have reviewed the triage vital signs and the nursing  notes.  Pertinent labs & imaging results that were available during my care of the patient were reviewed by me and considered in my medical decision making (see chart for details).     Patient with sudden onset abdominal pain.  Prior colectomy.  Hx of SBO.  Will check CT.  4:32 AM CT shows no evidence of SBO.  No acute intra-abdominal or pelvic process.  Feels improved after meds.  VSS.  Labs are reassuring.  PCP follow-up.  Return precautions given.  Final Clinical Impressions(s) / ED Diagnoses   Final diagnoses:  Generalized abdominal pain    New Prescriptions New Prescriptions   HYDROCODONE-ACETAMINOPHEN (NORCO/VICODIN) 5-325 MG TABLET    Take 1-2 tablets by mouth every 6 (six) hours as needed.   ONDANSETRON (ZOFRAN) 4 MG TABLET    Take 1 tablet (4 mg total) by mouth every 6 (six) hours.     BMontine Circle PA-C 09/05/16 0Robbins April, MD 09/05/16 [phone]

## 2016-09-05 NOTE — ED Notes (Signed)
Signature pad not working, pt and family member verbalized understanding of discharge instructions and prescriptions.  No questions at this time.

## 2016-09-11 DIAGNOSIS — J3089 Other allergic rhinitis: Secondary | ICD-10-CM | POA: Diagnosis not present

## 2016-09-11 DIAGNOSIS — J3081 Allergic rhinitis due to animal (cat) (dog) hair and dander: Secondary | ICD-10-CM | POA: Diagnosis not present

## 2016-09-11 DIAGNOSIS — J301 Allergic rhinitis due to pollen: Secondary | ICD-10-CM | POA: Diagnosis not present

## 2016-09-17 DIAGNOSIS — Z6828 Body mass index (BMI) 28.0-28.9, adult: Secondary | ICD-10-CM | POA: Diagnosis not present

## 2016-09-17 DIAGNOSIS — J3081 Allergic rhinitis due to animal (cat) (dog) hair and dander: Secondary | ICD-10-CM | POA: Diagnosis not present

## 2016-09-17 DIAGNOSIS — J301 Allergic rhinitis due to pollen: Secondary | ICD-10-CM | POA: Diagnosis not present

## 2016-09-17 DIAGNOSIS — J3089 Other allergic rhinitis: Secondary | ICD-10-CM | POA: Diagnosis not present

## 2016-09-17 DIAGNOSIS — M5416 Radiculopathy, lumbar region: Secondary | ICD-10-CM | POA: Diagnosis not present

## 2016-09-23 DIAGNOSIS — J3081 Allergic rhinitis due to animal (cat) (dog) hair and dander: Secondary | ICD-10-CM | POA: Diagnosis not present

## 2016-09-23 DIAGNOSIS — J301 Allergic rhinitis due to pollen: Secondary | ICD-10-CM | POA: Diagnosis not present

## 2016-09-23 DIAGNOSIS — J3089 Other allergic rhinitis: Secondary | ICD-10-CM | POA: Diagnosis not present

## 2016-09-30 DIAGNOSIS — J3081 Allergic rhinitis due to animal (cat) (dog) hair and dander: Secondary | ICD-10-CM | POA: Diagnosis not present

## 2016-09-30 DIAGNOSIS — J301 Allergic rhinitis due to pollen: Secondary | ICD-10-CM | POA: Diagnosis not present

## 2016-09-30 DIAGNOSIS — J3089 Other allergic rhinitis: Secondary | ICD-10-CM | POA: Diagnosis not present

## 2016-10-08 DIAGNOSIS — J3081 Allergic rhinitis due to animal (cat) (dog) hair and dander: Secondary | ICD-10-CM | POA: Diagnosis not present

## 2016-10-08 DIAGNOSIS — J301 Allergic rhinitis due to pollen: Secondary | ICD-10-CM | POA: Diagnosis not present

## 2016-10-08 DIAGNOSIS — J3089 Other allergic rhinitis: Secondary | ICD-10-CM | POA: Diagnosis not present

## 2016-10-09 DIAGNOSIS — J3089 Other allergic rhinitis: Secondary | ICD-10-CM | POA: Diagnosis not present

## 2016-10-09 DIAGNOSIS — R05 Cough: Secondary | ICD-10-CM | POA: Diagnosis not present

## 2016-10-09 DIAGNOSIS — J3081 Allergic rhinitis due to animal (cat) (dog) hair and dander: Secondary | ICD-10-CM | POA: Diagnosis not present

## 2016-10-09 DIAGNOSIS — J301 Allergic rhinitis due to pollen: Secondary | ICD-10-CM | POA: Diagnosis not present

## 2016-10-17 DIAGNOSIS — J301 Allergic rhinitis due to pollen: Secondary | ICD-10-CM | POA: Diagnosis not present

## 2016-10-17 DIAGNOSIS — J3081 Allergic rhinitis due to animal (cat) (dog) hair and dander: Secondary | ICD-10-CM | POA: Diagnosis not present

## 2016-10-17 DIAGNOSIS — J3089 Other allergic rhinitis: Secondary | ICD-10-CM | POA: Diagnosis not present

## 2016-10-21 DIAGNOSIS — J3089 Other allergic rhinitis: Secondary | ICD-10-CM | POA: Diagnosis not present

## 2016-10-21 DIAGNOSIS — J3081 Allergic rhinitis due to animal (cat) (dog) hair and dander: Secondary | ICD-10-CM | POA: Diagnosis not present

## 2016-10-21 DIAGNOSIS — J301 Allergic rhinitis due to pollen: Secondary | ICD-10-CM | POA: Diagnosis not present

## 2016-10-31 DIAGNOSIS — Z0000 Encounter for general adult medical examination without abnormal findings: Secondary | ICD-10-CM | POA: Diagnosis not present

## 2016-10-31 DIAGNOSIS — Z131 Encounter for screening for diabetes mellitus: Secondary | ICD-10-CM | POA: Diagnosis not present

## 2016-10-31 DIAGNOSIS — J301 Allergic rhinitis due to pollen: Secondary | ICD-10-CM | POA: Diagnosis not present

## 2016-10-31 DIAGNOSIS — R51 Headache: Secondary | ICD-10-CM | POA: Diagnosis not present

## 2016-10-31 DIAGNOSIS — J3081 Allergic rhinitis due to animal (cat) (dog) hair and dander: Secondary | ICD-10-CM | POA: Diagnosis not present

## 2016-10-31 DIAGNOSIS — Z13220 Encounter for screening for lipoid disorders: Secondary | ICD-10-CM | POA: Diagnosis not present

## 2016-10-31 DIAGNOSIS — J3089 Other allergic rhinitis: Secondary | ICD-10-CM | POA: Diagnosis not present

## 2016-10-31 DIAGNOSIS — Z23 Encounter for immunization: Secondary | ICD-10-CM | POA: Diagnosis not present

## 2016-10-31 DIAGNOSIS — F322 Major depressive disorder, single episode, severe without psychotic features: Secondary | ICD-10-CM | POA: Diagnosis not present

## 2016-11-05 DIAGNOSIS — J3089 Other allergic rhinitis: Secondary | ICD-10-CM | POA: Diagnosis not present

## 2016-11-05 DIAGNOSIS — J3081 Allergic rhinitis due to animal (cat) (dog) hair and dander: Secondary | ICD-10-CM | POA: Diagnosis not present

## 2016-11-05 DIAGNOSIS — J301 Allergic rhinitis due to pollen: Secondary | ICD-10-CM | POA: Diagnosis not present

## 2016-11-07 DIAGNOSIS — J3081 Allergic rhinitis due to animal (cat) (dog) hair and dander: Secondary | ICD-10-CM | POA: Diagnosis not present

## 2016-11-07 DIAGNOSIS — J301 Allergic rhinitis due to pollen: Secondary | ICD-10-CM | POA: Diagnosis not present

## 2016-11-07 DIAGNOSIS — J3089 Other allergic rhinitis: Secondary | ICD-10-CM | POA: Diagnosis not present

## 2016-11-11 DIAGNOSIS — M5416 Radiculopathy, lumbar region: Secondary | ICD-10-CM | POA: Diagnosis not present

## 2016-11-11 DIAGNOSIS — J3081 Allergic rhinitis due to animal (cat) (dog) hair and dander: Secondary | ICD-10-CM | POA: Diagnosis not present

## 2016-11-11 DIAGNOSIS — J3089 Other allergic rhinitis: Secondary | ICD-10-CM | POA: Diagnosis not present

## 2016-11-11 DIAGNOSIS — J301 Allergic rhinitis due to pollen: Secondary | ICD-10-CM | POA: Diagnosis not present

## 2016-11-18 DIAGNOSIS — J3081 Allergic rhinitis due to animal (cat) (dog) hair and dander: Secondary | ICD-10-CM | POA: Diagnosis not present

## 2016-11-18 DIAGNOSIS — J301 Allergic rhinitis due to pollen: Secondary | ICD-10-CM | POA: Diagnosis not present

## 2016-11-18 DIAGNOSIS — J3089 Other allergic rhinitis: Secondary | ICD-10-CM | POA: Diagnosis not present

## 2016-11-26 DIAGNOSIS — J3081 Allergic rhinitis due to animal (cat) (dog) hair and dander: Secondary | ICD-10-CM | POA: Diagnosis not present

## 2016-11-26 DIAGNOSIS — J3089 Other allergic rhinitis: Secondary | ICD-10-CM | POA: Diagnosis not present

## 2016-11-26 DIAGNOSIS — J301 Allergic rhinitis due to pollen: Secondary | ICD-10-CM | POA: Diagnosis not present

## 2016-12-10 DIAGNOSIS — J301 Allergic rhinitis due to pollen: Secondary | ICD-10-CM | POA: Diagnosis not present

## 2016-12-10 DIAGNOSIS — J3081 Allergic rhinitis due to animal (cat) (dog) hair and dander: Secondary | ICD-10-CM | POA: Diagnosis not present

## 2016-12-10 DIAGNOSIS — J3089 Other allergic rhinitis: Secondary | ICD-10-CM | POA: Diagnosis not present

## 2016-12-13 DIAGNOSIS — M5416 Radiculopathy, lumbar region: Secondary | ICD-10-CM | POA: Diagnosis not present

## 2016-12-13 DIAGNOSIS — G5761 Lesion of plantar nerve, right lower limb: Secondary | ICD-10-CM | POA: Diagnosis not present

## 2016-12-13 DIAGNOSIS — M79674 Pain in right toe(s): Secondary | ICD-10-CM | POA: Diagnosis not present

## 2016-12-13 DIAGNOSIS — M722 Plantar fascial fibromatosis: Secondary | ICD-10-CM | POA: Diagnosis not present

## 2016-12-13 DIAGNOSIS — R030 Elevated blood-pressure reading, without diagnosis of hypertension: Secondary | ICD-10-CM | POA: Diagnosis not present

## 2016-12-13 DIAGNOSIS — M7751 Other enthesopathy of right foot: Secondary | ICD-10-CM | POA: Diagnosis not present

## 2016-12-13 DIAGNOSIS — M2041 Other hammer toe(s) (acquired), right foot: Secondary | ICD-10-CM | POA: Diagnosis not present

## 2016-12-13 DIAGNOSIS — M545 Low back pain: Secondary | ICD-10-CM | POA: Diagnosis not present

## 2016-12-19 DIAGNOSIS — J3081 Allergic rhinitis due to animal (cat) (dog) hair and dander: Secondary | ICD-10-CM | POA: Diagnosis not present

## 2016-12-19 DIAGNOSIS — J3089 Other allergic rhinitis: Secondary | ICD-10-CM | POA: Diagnosis not present

## 2016-12-19 DIAGNOSIS — J301 Allergic rhinitis due to pollen: Secondary | ICD-10-CM | POA: Diagnosis not present

## 2016-12-20 DIAGNOSIS — Z01419 Encounter for gynecological examination (general) (routine) without abnormal findings: Secondary | ICD-10-CM | POA: Diagnosis not present

## 2016-12-20 DIAGNOSIS — Z803 Family history of malignant neoplasm of breast: Secondary | ICD-10-CM | POA: Diagnosis not present

## 2016-12-20 DIAGNOSIS — Z1231 Encounter for screening mammogram for malignant neoplasm of breast: Secondary | ICD-10-CM | POA: Diagnosis not present

## 2016-12-20 DIAGNOSIS — Z13820 Encounter for screening for osteoporosis: Secondary | ICD-10-CM | POA: Diagnosis not present

## 2016-12-20 DIAGNOSIS — Z6827 Body mass index (BMI) 27.0-27.9, adult: Secondary | ICD-10-CM | POA: Diagnosis not present

## 2016-12-20 DIAGNOSIS — M8588 Other specified disorders of bone density and structure, other site: Secondary | ICD-10-CM | POA: Diagnosis not present

## 2016-12-20 DIAGNOSIS — Z13228 Encounter for screening for other metabolic disorders: Secondary | ICD-10-CM | POA: Diagnosis not present

## 2016-12-20 DIAGNOSIS — Z800 Family history of malignant neoplasm of digestive organs: Secondary | ICD-10-CM | POA: Diagnosis not present

## 2016-12-20 DIAGNOSIS — Z1329 Encounter for screening for other suspected endocrine disorder: Secondary | ICD-10-CM | POA: Diagnosis not present

## 2016-12-20 DIAGNOSIS — Z131 Encounter for screening for diabetes mellitus: Secondary | ICD-10-CM | POA: Diagnosis not present

## 2016-12-25 DIAGNOSIS — J3089 Other allergic rhinitis: Secondary | ICD-10-CM | POA: Diagnosis not present

## 2016-12-25 DIAGNOSIS — J301 Allergic rhinitis due to pollen: Secondary | ICD-10-CM | POA: Diagnosis not present

## 2016-12-25 DIAGNOSIS — J3081 Allergic rhinitis due to animal (cat) (dog) hair and dander: Secondary | ICD-10-CM | POA: Diagnosis not present

## 2017-01-03 DIAGNOSIS — M25571 Pain in right ankle and joints of right foot: Secondary | ICD-10-CM | POA: Diagnosis not present

## 2017-01-03 DIAGNOSIS — M7751 Other enthesopathy of right foot: Secondary | ICD-10-CM | POA: Diagnosis not present

## 2017-01-10 DIAGNOSIS — J301 Allergic rhinitis due to pollen: Secondary | ICD-10-CM | POA: Diagnosis not present

## 2017-01-10 DIAGNOSIS — M722 Plantar fascial fibromatosis: Secondary | ICD-10-CM | POA: Diagnosis not present

## 2017-01-10 DIAGNOSIS — J3081 Allergic rhinitis due to animal (cat) (dog) hair and dander: Secondary | ICD-10-CM | POA: Diagnosis not present

## 2017-01-10 DIAGNOSIS — J3089 Other allergic rhinitis: Secondary | ICD-10-CM | POA: Diagnosis not present

## 2017-01-10 DIAGNOSIS — Z01812 Encounter for preprocedural laboratory examination: Secondary | ICD-10-CM | POA: Diagnosis not present

## 2017-01-10 DIAGNOSIS — M25571 Pain in right ankle and joints of right foot: Secondary | ICD-10-CM | POA: Diagnosis not present

## 2017-01-10 DIAGNOSIS — M2041 Other hammer toe(s) (acquired), right foot: Secondary | ICD-10-CM | POA: Diagnosis not present

## 2017-01-10 DIAGNOSIS — Z79899 Other long term (current) drug therapy: Secondary | ICD-10-CM | POA: Diagnosis not present

## 2017-01-16 DIAGNOSIS — J3089 Other allergic rhinitis: Secondary | ICD-10-CM | POA: Diagnosis not present

## 2017-01-16 DIAGNOSIS — J301 Allergic rhinitis due to pollen: Secondary | ICD-10-CM | POA: Diagnosis not present

## 2017-01-16 DIAGNOSIS — J3081 Allergic rhinitis due to animal (cat) (dog) hair and dander: Secondary | ICD-10-CM | POA: Diagnosis not present

## 2017-01-20 DIAGNOSIS — J3081 Allergic rhinitis due to animal (cat) (dog) hair and dander: Secondary | ICD-10-CM | POA: Diagnosis not present

## 2017-01-20 DIAGNOSIS — J3089 Other allergic rhinitis: Secondary | ICD-10-CM | POA: Diagnosis not present

## 2017-01-20 DIAGNOSIS — J301 Allergic rhinitis due to pollen: Secondary | ICD-10-CM | POA: Diagnosis not present

## 2017-01-21 DIAGNOSIS — M216X9 Other acquired deformities of unspecified foot: Secondary | ICD-10-CM | POA: Diagnosis not present

## 2017-01-21 DIAGNOSIS — M25571 Pain in right ankle and joints of right foot: Secondary | ICD-10-CM | POA: Diagnosis not present

## 2017-01-21 DIAGNOSIS — M2041 Other hammer toe(s) (acquired), right foot: Secondary | ICD-10-CM | POA: Diagnosis not present

## 2017-01-21 DIAGNOSIS — M216X1 Other acquired deformities of right foot: Secondary | ICD-10-CM | POA: Diagnosis not present

## 2017-01-21 DIAGNOSIS — M24477 Recurrent dislocation, right toe(s): Secondary | ICD-10-CM | POA: Diagnosis not present

## 2017-01-21 DIAGNOSIS — M722 Plantar fascial fibromatosis: Secondary | ICD-10-CM | POA: Diagnosis not present

## 2017-01-24 DIAGNOSIS — M25571 Pain in right ankle and joints of right foot: Secondary | ICD-10-CM | POA: Diagnosis not present

## 2017-01-31 DIAGNOSIS — J3081 Allergic rhinitis due to animal (cat) (dog) hair and dander: Secondary | ICD-10-CM | POA: Diagnosis not present

## 2017-01-31 DIAGNOSIS — J3089 Other allergic rhinitis: Secondary | ICD-10-CM | POA: Diagnosis not present

## 2017-01-31 DIAGNOSIS — J301 Allergic rhinitis due to pollen: Secondary | ICD-10-CM | POA: Diagnosis not present

## 2017-02-03 DIAGNOSIS — J3089 Other allergic rhinitis: Secondary | ICD-10-CM | POA: Diagnosis not present

## 2017-02-03 DIAGNOSIS — J301 Allergic rhinitis due to pollen: Secondary | ICD-10-CM | POA: Diagnosis not present

## 2017-02-06 DIAGNOSIS — M25571 Pain in right ankle and joints of right foot: Secondary | ICD-10-CM | POA: Diagnosis not present

## 2017-02-11 DIAGNOSIS — J3081 Allergic rhinitis due to animal (cat) (dog) hair and dander: Secondary | ICD-10-CM | POA: Diagnosis not present

## 2017-02-11 DIAGNOSIS — J301 Allergic rhinitis due to pollen: Secondary | ICD-10-CM | POA: Diagnosis not present

## 2017-02-11 DIAGNOSIS — M5416 Radiculopathy, lumbar region: Secondary | ICD-10-CM | POA: Diagnosis not present

## 2017-02-11 DIAGNOSIS — J3089 Other allergic rhinitis: Secondary | ICD-10-CM | POA: Diagnosis not present

## 2017-02-12 ENCOUNTER — Emergency Department (HOSPITAL_COMMUNITY): Payer: BLUE CROSS/BLUE SHIELD

## 2017-02-12 ENCOUNTER — Emergency Department (HOSPITAL_COMMUNITY)
Admission: EM | Admit: 2017-02-12 | Discharge: 2017-02-13 | Disposition: A | Discharge status: Home / Self Care | Payer: BLUE CROSS/BLUE SHIELD | Attending: Emergency Medicine | Admitting: Emergency Medicine

## 2017-02-12 ENCOUNTER — Encounter (HOSPITAL_COMMUNITY): Payer: Self-pay | Admitting: *Deleted

## 2017-02-12 ENCOUNTER — Other Ambulatory Visit: Payer: Self-pay

## 2017-02-12 DIAGNOSIS — Z950 Presence of cardiac pacemaker: Secondary | ICD-10-CM | POA: Insufficient documentation

## 2017-02-12 DIAGNOSIS — F22 Delusional disorders: Secondary | ICD-10-CM | POA: Insufficient documentation

## 2017-02-12 DIAGNOSIS — N390 Urinary tract infection, site not specified: Secondary | ICD-10-CM | POA: Diagnosis not present

## 2017-02-12 DIAGNOSIS — K5180 Other ulcerative colitis without complications: Secondary | ICD-10-CM | POA: Insufficient documentation

## 2017-02-12 DIAGNOSIS — Z91040 Latex allergy status: Secondary | ICD-10-CM | POA: Diagnosis not present

## 2017-02-12 DIAGNOSIS — R197 Diarrhea, unspecified: Secondary | ICD-10-CM

## 2017-02-12 DIAGNOSIS — Z79899 Other long term (current) drug therapy: Secondary | ICD-10-CM | POA: Insufficient documentation

## 2017-02-12 DIAGNOSIS — R52 Pain, unspecified: Secondary | ICD-10-CM

## 2017-02-12 DIAGNOSIS — R4182 Altered mental status, unspecified: Secondary | ICD-10-CM | POA: Diagnosis not present

## 2017-02-12 DIAGNOSIS — M549 Dorsalgia, unspecified: Secondary | ICD-10-CM | POA: Diagnosis not present

## 2017-02-12 DIAGNOSIS — M79671 Pain in right foot: Secondary | ICD-10-CM | POA: Diagnosis not present

## 2017-02-12 DIAGNOSIS — R1110 Vomiting, unspecified: Secondary | ICD-10-CM | POA: Diagnosis not present

## 2017-02-12 LAB — COMPREHENSIVE METABOLIC PANEL
ALBUMIN: 3.8 g/dL (ref 3.5–5.0)
ALT: 30 U/L (ref 14–54)
ANION GAP: 10 (ref 5–15)
AST: 41 U/L (ref 15–41)
Alkaline Phosphatase: 84 U/L (ref 38–126)
BILIRUBIN TOTAL: 0.8 mg/dL (ref 0.3–1.2)
BUN: 16 mg/dL (ref 6–20)
CHLORIDE: 106 mmol/L (ref 101–111)
CO2: 25 mmol/L (ref 22–32)
Calcium: 9.5 mg/dL (ref 8.9–10.3)
Creatinine, Ser: 0.99 mg/dL (ref 0.44–1.00)
GFR calc Af Amer: >60 mL/min (ref >60)
GFR calc non Af Amer: >60 mL/min (ref >60)
GLUCOSE: 126 mg/dL — AB (ref 65–99)
POTASSIUM: 4.2 mmol/L (ref 3.5–5.1)
SODIUM: 141 mmol/L (ref 135–145)
TOTAL PROTEIN: 7.3 g/dL (ref 6.5–8.1)

## 2017-02-12 LAB — CBC
HEMATOCRIT: 36.8 % (ref 36.0–46.0)
HEMOGLOBIN: 11.6 g/dL — AB (ref 12.0–15.0)
MCH: 27 pg (ref 26.0–34.0)
MCHC: 31.5 g/dL (ref 30.0–36.0)
MCV: 85.6 fL (ref 78.0–100.0)
Platelets: 482 10*3/uL — ABNORMAL HIGH (ref 150–400)
RBC: 4.3 MIL/uL (ref 3.87–5.11)
RDW: 17.5 % — ABNORMAL HIGH (ref 11.5–15.5)
WBC: 15.1 10*3/uL — ABNORMAL HIGH (ref 4.0–10.5)

## 2017-02-12 LAB — ETHANOL: Alcohol, Ethyl (B): <10 mg/dL (ref <10)

## 2017-02-12 LAB — LIPASE, BLOOD: LIPASE: 28 U/L (ref 11–51)

## 2017-02-12 LAB — POC OCCULT BLOOD, ED: Fecal Occult Bld: NEGATIVE

## 2017-02-12 MED ORDER — ESCITALOPRAM OXALATE 10 MG PO TABS
30.0000 mg | ORAL_TABLET | Freq: Every day | ORAL | Status: DC
  Administered 2017-02-13: 30 mg via ORAL
  Filled 2017-02-12: qty 3

## 2017-02-12 MED ORDER — IOPAMIDOL (ISOVUE-300) INJECTION 61%
INTRAVENOUS | Status: AC
  Administered 2017-02-12: 100 mL
  Filled 2017-02-12: qty 100

## 2017-02-12 MED ORDER — OXYCODONE-ACETAMINOPHEN 5-325 MG PO TABS
1.0000 | ORAL_TABLET | ORAL | Status: DC | PRN
  Administered 2017-02-13 (×2): 2 via ORAL
  Filled 2017-02-12 (×2): qty 2

## 2017-02-12 MED ORDER — ZOLPIDEM TARTRATE 5 MG PO TABS: 10.0000 mg | ORAL_TABLET | Freq: Every evening | ORAL | Status: DC | PRN

## 2017-02-12 MED ORDER — SODIUM CHLORIDE 0.9 % IV BOLUS (SEPSIS)
1000.0000 mL | Freq: Once | INTRAVENOUS | Status: AC
  Administered 2017-02-12: 1000 mL via INTRAVENOUS

## 2017-02-12 MED ORDER — ACETAMINOPHEN 325 MG PO TABS: 650.0000 mg | ORAL_TABLET | Freq: Four times a day (QID) | ORAL | Status: DC | PRN

## 2017-02-12 MED ORDER — ROPINIROLE HCL 1 MG PO TABS
1.0000 mg | ORAL_TABLET | Freq: Every day | ORAL | Status: DC
  Administered 2017-02-13: 1 mg via ORAL
  Filled 2017-02-12: qty 1

## 2017-02-12 MED ORDER — GABAPENTIN 300 MG PO CAPS
300.0000 mg | ORAL_CAPSULE | Freq: Two times a day (BID) | ORAL | Status: DC
  Administered 2017-02-13 (×2): 300 mg via ORAL
  Filled 2017-02-12 (×2): qty 1

## 2017-02-12 MED ORDER — HYDROMORPHONE HCL 1 MG/ML IJ SOLN
1.0000 mg | Freq: Once | INTRAMUSCULAR | Status: AC
  Administered 2017-02-13: 1 mg via INTRAVENOUS
  Filled 2017-02-12 (×2): qty 1

## 2017-02-12 MED ORDER — LOPERAMIDE HCL 2 MG PO CAPS
2.0000 mg | ORAL_CAPSULE | ORAL | Status: DC
  Administered 2017-02-13 (×4): 2 mg via ORAL
  Filled 2017-02-12 (×4): qty 1

## 2017-02-12 MED ORDER — CIPROFLOXACIN HCL 500 MG PO TABS
1000.0000 mg | ORAL_TABLET | Freq: Every day | ORAL | Status: DC
  Administered 2017-02-13: 1000 mg via ORAL
  Filled 2017-02-12: qty 2

## 2017-02-12 NOTE — ED Provider Notes (Signed)
[facility] EMERGENCY DEPARTMENT Provider Note   CSN: [id number] Arrival date & time: 02/12/17  1436     History   Chief Complaint Chief Complaint  Patient presents with  . Altered Mental Status    HPI [name] is a 57 y.o. female hx of complicated ulcerative colitis s/p J pouch, chronic back pain s/p stimulator, here with diarrhea, blood in stool, delusions.  Patient states that she had a J-pouch put in at [facility] several years ago and has been on Cipro chronically.  She has been having more diarrhea and some blood in her stool which is slightly worsening than her chronic symptoms.  She denies any fevers or vomiting.  She states that the Imodium has not helped much.  She states that she is under severe stress from troubles with her house as well as family situations.  She has been more tearful since her right foot surgery at the end of December.  Per the husband, patient left parties recently because she is very anxious and tearful.  She states that she just does not want to live anymore but denies any actual plan to kill herself.  He is on Percocet at baseline but denies overdosing on Percocet.  The husband, she also has delusions and feels that people are after her.  He has no known psychiatric disorder.  The history is provided by the patient.    Past Medical History:  Diagnosis Date  . Ulcerative colitis (Cataract)     There are no active problems to display for this patient.   Past Surgical History:  Procedure Laterality Date  . COLOPROCTECTOMY W/ ILEO J POUCH    . PACEMAKER IMPLANT      OB History    No data available       Home Medications    Prior to Admission medications   Medication Sig Start Date End Date Taking? Authorizing Provider  acetaminophen (TYLENOL) 325 MG tablet Take 650 mg by mouth every 6 (six) hours as needed for mild pain.   Yes [provider]  albuterol (PROVENTIL HFA;VENTOLIN HFA) 108 (90 Base) MCG/ACT inhaler Inhale  1-2 puffs into the lungs every 6 (six) hours as needed for wheezing or shortness of breath.   Yes [provider]  ciprofloxacin (CIPRO) 500 MG tablet Take 1,000 mg by mouth daily.    Yes [provider]  escitalopram (LEXAPRO) 20 MG tablet Take 30 mg by mouth daily.    Yes [provider]  fluticasone (FLONASE) 50 MCG/ACT nasal spray Place 1-2 sprays into both nostrils daily as needed for allergies or rhinitis.   Yes [provider]  fluticasone furoate-vilanterol (BREO ELLIPTA) 100-25 MCG/INH AEPB Inhale 1 puff into the lungs daily.   Yes [provider]  gabapentin (NEURONTIN) 300 MG capsule Take 300 mg by mouth 2 (two) times daily.   Yes [provider]  loperamide (IMODIUM) 2 MG capsule Take 2 mg by mouth every 3 (three) hours.   Yes [provider]  oxyCODONE-acetaminophen (PERCOCET/ROXICET) 5-325 MG tablet Take 1-2 tablets by mouth every 4 (four) hours as needed for severe pain.   Yes [provider]  rOPINIRole (REQUIP) 1 MG tablet Take 1 mg by mouth at bedtime.    Yes [provider]  Vitamin D, Ergocalciferol, (DRISDOL) 50000 units CAPS capsule Take 50,000 Units by mouth every 7 (seven) days. On Thursday   Yes [provider]  zolpidem (AMBIEN) 10 MG tablet Take 10 mg by  mouth at bedtime as needed for sleep.   Yes [provider]  HYDROcodone-acetaminophen (NORCO/VICODIN) 5-325 MG tablet Take 1-2 tablets by mouth every 6 (six) hours as needed. Patient not taking: Reported on 02/12/2017 09/05/16   Montine Circle, PA-C  ondansetron (ZOFRAN) 4 MG tablet Take 1 tablet (4 mg total) by mouth every 6 (six) hours. Patient not taking: Reported on 02/12/2017 09/05/16   Montine Circle, PA-C    Family History No family history on file.  Social History Social History   Tobacco Use  . Smoking status: Never Smoker  . Smokeless tobacco: Never Used  Substance Use Topics  . Alcohol use: Yes     Alcohol/week: 0.6 oz    Types: 1 Glasses of wine per week    Comment: 1-2 times a week  . Drug use: No     Allergies   Latex and Morphine and related   Review of Systems Review of Systems  Gastrointestinal: Positive for diarrhea.  Psychiatric/Behavioral: Positive for agitation and dysphoric mood.  All other systems reviewed and are negative.    Physical Exam Updated Vital Signs BP (!) 149/82   Pulse 98   Temp 98.7 F (37.1 C) (Oral)   Resp 18   SpO2 99%   Physical Exam  Constitutional: She is oriented to person, place, and time. She appears well-developed.  HENT:  Head: Normocephalic.  Mouth/Throat: Oropharynx is clear and moist.  Eyes: Conjunctivae and EOM are normal. Pupils are equal, round, and reactive to light.  Neck: Normal range of motion. Neck supple.  Cardiovascular: Normal rate, regular rhythm and normal heart sounds.  Pulmonary/Chest: Effort normal and breath sounds normal. No stridor. No respiratory distress. She has no wheezes.  Abdominal: Soft. Bowel sounds are normal. She exhibits no distension. There is no tenderness.  Musculoskeletal: Normal range of motion.  R foot with boot.   Neurological: She is alert and oriented to person, place, and time.  Skin: Skin is warm.  Psychiatric:  Tearful, crying   Nursing note and vitals reviewed.    ED Treatments / Results  Labs (all labs ordered are listed, but only abnormal results are displayed) Labs Reviewed  COMPREHENSIVE METABOLIC PANEL - Abnormal; Notable for the following components:      Result Value   Glucose, Bld 126 (*)    All other components within normal limits  CBC - Abnormal; Notable for the following components:   WBC 15.1 (*)    Hemoglobin 11.6 (*)    RDW 17.5 (*)    Platelets 482 (*)    All other components within normal limits  ETHANOL  LIPASE, BLOOD  RAPID URINE DRUG SCREEN, HOSP PERFORMED  URINALYSIS, ROUTINE W REFLEX MICROSCOPIC  POC OCCULT BLOOD, ED  POC OCCULT BLOOD, ED     EKG  EKG Interpretation None       Radiology Dg Chest 2 View  Result Date: 02/12/2017 CLINICAL DATA:  Altered mental status EXAM: CHEST  2 VIEW COMPARISON:  Chest radiograph 05/30/2016 FINDINGS: The heart size and mediastinal contours are within normal limits. Both lungs are clear. The visualized skeletal structures are unremarkable. IMPRESSION: No active cardiopulmonary disease. Electronically Signed   By: Ulyses Jarred M.D.   On: 02/12/2017 23:01   Ct Abdomen Pelvis W Contrast  Result Date: 02/12/2017 CLINICAL DATA:  Nausea vomiting. EXAM: CT ABDOMEN AND PELVIS WITH CONTRAST TECHNIQUE: Multidetector CT imaging of the abdomen and pelvis was performed using the standard protocol following bolus administration of intravenous contrast. CONTRAST:  100  cc Isovue 300 intravenously. COMPARISON:  09/05/2016 FINDINGS: Lower chest: No acute abnormality. Hepatobiliary: No focal liver abnormality is seen. No gallstones, gallbladder wall thickening, or biliary dilatation. Pancreas: Slightly hypoattenuated appearance of the head of the pancreas without associated peripancreatic fat stranding. No pancreatic ductal dilatation or surrounding inflammatory changes. Spleen: Normal in size without focal abnormality. Adrenals/Urinary Tract: Adrenal glands are unremarkable. Kidneys are normal, without renal calculi, focal lesion, or hydronephrosis. Bladder is unremarkable. Stomach/Bowel: Stomach is within normal limits. Stable postsurgical changes from prior coloproctectomy with ileal J-pouch formation.No evidence of bowel wall thickening, distention, or inflammatory changes. Vascular/Lymphatic: No significant vascular findings are present. No enlarged abdominal or pelvic lymph nodes. Reproductive: Uterus and bilateral adnexa are unremarkable. Other: No abdominal wall hernia or abnormality. No abdominopelvic ascites. Musculoskeletal: Simulated device present within the left lateral abdominal wall, with lead entering at  the level of the distal left sacrum. Sequela of prior L4-L5 fusion. IMPRESSION: Slightly hypoattenuated appearance of the head of the pancreas without associated peripancreatic fat stranding. Please correlate to serum pancreatic enzymes to exclude acute pancreatitis. Otherwise no acute abnormalities within the solid abdominal organs. Stable postsurgical changes from coloproctectomy and ileal J-pouch formation without complicating features. Electronically Signed   By: Fidela Salisbury M.D.   On: 02/12/2017 23:34   Dg Foot Complete Right  Result Date: 02/12/2017 CLINICAL DATA:  Foot pain status post surgery EXAM: RIGHT FOOT COMPLETE - 3+ VIEW COMPARISON:  Right foot radiograph 09/02/2016 FINDINGS: There is a percutaneous wire traversing the second ray from the distal phalanx to the proximal metatarsal. There is a staple with spacer device at the medial cuneiform. There is no abnormal lucency surrounding any of the hardware. No acute fracture or dislocation. IMPRESSION: Postsurgical changes of the right foot without acute abnormality. Electronically Signed   By: Ulyses Jarred M.D.   On: 02/12/2017 23:04    Procedures Procedures (including critical care time)  Medications Ordered in ED Medications  HYDROmorphone (DILAUDID) injection 1 mg (not administered)  acetaminophen (TYLENOL) tablet 650 mg (not administered)  ciprofloxacin (CIPRO) tablet 1,000 mg (not administered)  escitalopram (LEXAPRO) tablet 30 mg (not administered)  gabapentin (NEURONTIN) capsule 300 mg (not administered)  loperamide (IMODIUM) capsule 2 mg (not administered)  oxyCODONE-acetaminophen (PERCOCET/ROXICET) 5-325 MG per tablet 1-2 tablet (not administered)  rOPINIRole (REQUIP) tablet 1 mg (not administered)  zolpidem (AMBIEN) tablet 10 mg (not administered)  sodium chloride 0.9 % bolus 1,000 mL (1,000 mLs Intravenous New Bag/Given 02/12/17 2226)  iopamidol (ISOVUE-300) 61 % injection (100 mLs  Contrast Given 02/12/17 2301)      Initial Impression / Assessment and Plan / ED Course  I have reviewed the triage vital signs and the nursing notes.  Pertinent labs & imaging results that were available during my care of the patient were reviewed by me and considered in my medical decision making (see chart for details).    [name] is a 57 y.o. female here with delusions, chronic abdominal pain and diarrhea. She had J pouch done at [facility]. Given persistent GI symptoms, will get CT ab/pel to further assess. Will also consult psych for evaluation.   11:53 PM CT ab/pel showed stable pouch. WBC 15 but afebrile, no signs of infection. Lipase nl. CXR clear. UDS and UA pending. Psych consult pending. Dr. Roxanne Mins aware in the ED.    Final Clinical Impressions(s) / ED Diagnoses   Final diagnoses:  Pain    ED Discharge Orders    None  Drenda Freeze, MD 02/12/17 [phone]

## 2017-02-12 NOTE — ED Notes (Signed)
Pt called x1 for vitals. No answer

## 2017-02-12 NOTE — ED Notes (Signed)
Patient transported to X-ray 

## 2017-02-12 NOTE — ED Notes (Signed)
Pt called x2 for vitals. No answer

## 2017-02-12 NOTE — ED Notes (Signed)
Called pt for triage, no answer

## 2017-02-12 NOTE — Progress Notes (Signed)
TTS attempted to complete assessment but pt's nurse Jinny Blossom, RN requested this writer call back due to being unavailable at this time.  Lind Covert, MSW, LCSW Therapeutic Triage Specialist  [phone]

## 2017-02-12 NOTE — ED Triage Notes (Signed)
Pt brought to ED from home for eval of AMS since foot surgery 3 wks ago. Pt's husband accompanies today and states since last pm pt has become very 'delusional'. Pt is alert and oriented. States she knows she is feeling like someone is after her but doesn't know that this feeling is true or not. Reassured pt she is safe here. Denies pain. Surgical site without s/s infection. Pt denies sob or cp. Pt and husband states pt was prescribed 58 Percocet post surgery and she has 20-25 left. Pt and husband both tearful.

## 2017-02-13 ENCOUNTER — Encounter (HOSPITAL_COMMUNITY): Payer: Self-pay | Admitting: Registered Nurse

## 2017-02-13 LAB — URINALYSIS, ROUTINE W REFLEX MICROSCOPIC
Bilirubin Urine: NEGATIVE
GLUCOSE, UA: NEGATIVE mg/dL
Ketones, ur: NEGATIVE mg/dL
Nitrite: NEGATIVE
Protein, ur: NEGATIVE mg/dL
Specific Gravity, Urine: 1.023 (ref 1.005–1.030)
pH: 7 (ref 5.0–8.0)

## 2017-02-13 LAB — RAPID URINE DRUG SCREEN, HOSP PERFORMED
Amphetamines: NOT DETECTED
BARBITURATES: NOT DETECTED
Benzodiazepines: NOT DETECTED
COCAINE: NOT DETECTED
Opiates: NOT DETECTED
Tetrahydrocannabinol: NOT DETECTED

## 2017-02-13 MED ORDER — DIPHENHYDRAMINE HCL 50 MG/ML IJ SOLN
INTRAMUSCULAR | Status: AC
  Administered 2017-02-13: 25 mg
  Filled 2017-02-13: qty 1

## 2017-02-13 MED ORDER — ARIPIPRAZOLE 2 MG PO TABS: 2.0000 mg | ORAL_TABLET | Freq: Every day | ORAL | 0 refills | Status: DC

## 2017-02-13 MED ORDER — FOSFOMYCIN TROMETHAMINE 3 G PO PACK
3.0000 g | PACK | Freq: Once | ORAL | Status: AC
  Administered 2017-02-13: 3 g via ORAL
  Filled 2017-02-13: qty 3

## 2017-02-13 NOTE — ED Notes (Signed)
TTS at bedside. Pt requesting dilaudid after finishing TTS.

## 2017-02-13 NOTE — Discharge Instructions (Signed)
Please begin taking the Abilify daily as directed by the psychiatry team.  Please follow-up as directed with the outpatient team.  Please follow-up with your primary medical team for further management of your urinary tract infection.  If any symptoms change or worsen, please return to the nearest emergency department.

## 2017-02-13 NOTE — BH Assessment (Signed)
[facility] Assessment Progress Note  Per Patriciaann Clan, PA pt is recommended for continued observation for safety and stabilization and reassessment in the AM by psych. EDP Dr. Roxanne Mins, MD notified of recommendation. TTS spoke with "Mortimer Fries" who stated the pt's nurse is unavailable but he will inform her of the disposition.   Lind Covert, MSW, LCSW Therapeutic Triage Specialist  [phone]

## 2017-02-13 NOTE — Progress Notes (Signed)
Per Earleen Newport, NP, the patient does not meet criteria for inpatient treatment. The patient is recommended for discharge and to follow up with outpatient providers.   The patient is psychiatrically cleared at this time. Patient was seen via telepsych by [facility], Earleen Newport, NP. Patient's chart was reviewed and consulted with Dr. Dwyane Dee on 02/13/17  RN/EDP aware   Radonna Ricker, MSW, [place] Worker (Disposition) [facility]  [phone]
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
## 2017-02-13 NOTE — ED Notes (Signed)
TTS machine stopped working, this RN attempted to restart computer, pt given phone on speaker phone to complete assessment.

## 2017-02-13 NOTE — ED Notes (Addendum)
At 0135 during downtime, pt reports to this RN that she feels itchy. This RN confirmed with pt prior to dilaudid administrtion that she has had dilaudid before and does not have an allergy to it. Roxanne Mins, MD notified and gave this RN a verbal order for 25 mg of benadryl IV. Pt given 25 mg of benadryl IV at 0145 during downtime.

## 2017-02-13 NOTE — Consult Note (Signed)
  Tele Assessment Note    [name], 57 y.o., female patient presented to [facility] accompanied with her husband and complaints that after her surgery 3 weeks ago she has been disorganized, paranoid, and confused, increase depression related to family stressors.    Patient seen via telepsych by this provider; chart reviewed and consulted with Dr. Dwyane Dee on 02/13/17.  Chart review medical also informs history of complicated ulcerative colitis s/p J pouch with chronic use of Cipro, diarrhea with blood in stool which is slightly worse.  Urinalysis abnormal  On evaluation [name] reports that she has no psychiatric history but after her surgery 3 weeks ago patient gradually starting having paranoia.  Patient also states that she has a lot of family stressors that has worsened since her surgery "My grandmother in-law passed away, my father in-law is on chemo, my family side is a mess, and my daughter is in a rough marriage.  It's just a lot of things going on.  Patient husband at bedside and states that paranoia has been getting worse and yesterday it got really bad.  "We went to a party on New Years Eve and we had to leave the party because she was crying saying that the other were talking about her; at another get together we had to leave early because she said that she was feeling isolated from everyone there.  We've been married for 35 years and nothing like this has ever happened."  Patient states today she is not feeling as confused "but I still feel like something is bottled inside."  Patient denies suicidal/homicidal/self-harm ideation and auditory/visual hallucinations.  Patient continues to endorse paranoia that people are watching or spying on her.    During evaluation [name] is alert/oriented x 4; calm/cooperative with pleasant affect but crying on/off during interview related to not knowing what is going on with her.  She does not appear to be responding to internal/external  stimuli.  Patient denies suicidal/self-harm/homicidal ideation, and psychosis.  Continues to have some paranoia but states that it is not as bad as it was yesterday.  Patient answered question appropriately.  Patient currently taking Lexapro 30 mg daily prescribed by her PCP; urinalysis abnormal and increase in WBC.  Spoke with Dr Gustavus Messing related to above assessment and Lab findings related to chronic use of Cipro and culture and sensitivity to see if another antibiotic may be needed.  Also discussed starting Abilify 2 mg daily for Major Depression with psychiatric features.  Patient willing to follow up with outpatient services that are christian based.  Husband in agreement that inpatient psychiatric treatment is not needed; patient safe at home and will follow up with outpatient services.           Recommendations:  Continue Lexapro; Start Abilify 2 mg daily; Assess if a different antibiotic is necessary for infection.    Disposition: No evidence of imminent risk to self or others at present.   Supportive therapy provided about ongoing stressors. Discussed crisis plan, support from social network, calling 911, coming to the Emergency Department, and calling Suicide Hotline.   [name] B. Mary Eid, NP

## 2017-02-13 NOTE — ED Provider Notes (Signed)
11:22 AM Patient was awaiting urinalysis and TTS evaluation for further management of mental health problems.    TTS called to report that they feel patient is safe for discharge home with the addition of Abilify and outpatient follow-up.  In regards to the patient's urinalysis, and there were leukocytes and bacteria.  Given the patient is currently on chronic Cipro, pharmacy recommended 1 dose of fosfomycin to treat UTI.  This was ordered and taken.    Patient was reevaluated and agreed with plan of outpatient management of her delusions.  Patient and husband had no other questions or concerns and understood return precautions.  Patient was discharged in good condition.     Clinical Impression: 1. Diarrhea, unspecified type   2. Pain   3. Delusions ([place])   4. Lower urinary tract infectious disease     Disposition: Discharge  Condition: Good  I have discussed the results, Dx and Tx plan with the pt(& family if present). He/she/they expressed understanding and agree(s) with the plan. Discharge instructions discussed at great length. Strict return precautions discussed and pt &/or family have verbalized understanding of the instructions. No further questions at time of discharge.    New Prescriptions   ARIPIPRAZOLE (ABILIFY) 2 MG TABLET    Take 1 tablet (2 mg total) by mouth daily.    Follow Up: Gaynelle Arabian, MD [address] [place] [address] [place] [address] [id number] mc [place]        Nathalie Cavendish, Gwenyth Allegra, MD 02/13/17 1140

## 2017-02-13 NOTE — ED Notes (Signed)
[name], spouse: [phone]

## 2017-02-13 NOTE — ED Notes (Signed)
Per TTS, keep overnight and re-evaluate in the morning

## 2017-02-17 DIAGNOSIS — R05 Cough: Secondary | ICD-10-CM | POA: Diagnosis not present

## 2017-02-17 DIAGNOSIS — R410 Disorientation, unspecified: Secondary | ICD-10-CM | POA: Diagnosis not present

## 2017-02-19 DIAGNOSIS — J301 Allergic rhinitis due to pollen: Secondary | ICD-10-CM | POA: Diagnosis not present

## 2017-02-19 DIAGNOSIS — J3081 Allergic rhinitis due to animal (cat) (dog) hair and dander: Secondary | ICD-10-CM | POA: Diagnosis not present

## 2017-02-19 DIAGNOSIS — J3089 Other allergic rhinitis: Secondary | ICD-10-CM | POA: Diagnosis not present

## 2017-02-19 DIAGNOSIS — M12271 Villonodular synovitis (pigmented), right ankle and foot: Secondary | ICD-10-CM | POA: Diagnosis not present

## 2017-02-26 DIAGNOSIS — J3081 Allergic rhinitis due to animal (cat) (dog) hair and dander: Secondary | ICD-10-CM | POA: Diagnosis not present

## 2017-02-26 DIAGNOSIS — J3089 Other allergic rhinitis: Secondary | ICD-10-CM | POA: Diagnosis not present

## 2017-02-28 DIAGNOSIS — J3081 Allergic rhinitis due to animal (cat) (dog) hair and dander: Secondary | ICD-10-CM | POA: Diagnosis not present

## 2017-02-28 DIAGNOSIS — J3089 Other allergic rhinitis: Secondary | ICD-10-CM | POA: Diagnosis not present

## 2017-02-28 DIAGNOSIS — J301 Allergic rhinitis due to pollen: Secondary | ICD-10-CM | POA: Diagnosis not present

## 2017-03-03 DIAGNOSIS — M12271 Villonodular synovitis (pigmented), right ankle and foot: Secondary | ICD-10-CM | POA: Diagnosis not present

## 2017-03-05 DIAGNOSIS — J3081 Allergic rhinitis due to animal (cat) (dog) hair and dander: Secondary | ICD-10-CM | POA: Diagnosis not present

## 2017-03-05 DIAGNOSIS — J301 Allergic rhinitis due to pollen: Secondary | ICD-10-CM | POA: Diagnosis not present

## 2017-03-05 DIAGNOSIS — J3089 Other allergic rhinitis: Secondary | ICD-10-CM | POA: Diagnosis not present

## 2017-03-11 DIAGNOSIS — J3089 Other allergic rhinitis: Secondary | ICD-10-CM | POA: Diagnosis not present

## 2017-03-11 DIAGNOSIS — J3081 Allergic rhinitis due to animal (cat) (dog) hair and dander: Secondary | ICD-10-CM | POA: Diagnosis not present

## 2017-03-11 DIAGNOSIS — J301 Allergic rhinitis due to pollen: Secondary | ICD-10-CM | POA: Diagnosis not present

## 2017-03-17 DIAGNOSIS — J301 Allergic rhinitis due to pollen: Secondary | ICD-10-CM | POA: Diagnosis not present

## 2017-03-17 DIAGNOSIS — J3089 Other allergic rhinitis: Secondary | ICD-10-CM | POA: Diagnosis not present

## 2017-03-17 DIAGNOSIS — J3081 Allergic rhinitis due to animal (cat) (dog) hair and dander: Secondary | ICD-10-CM | POA: Diagnosis not present

## 2017-03-19 DIAGNOSIS — F4320 Adjustment disorder, unspecified: Secondary | ICD-10-CM | POA: Diagnosis not present

## 2017-03-21 DIAGNOSIS — M25571 Pain in right ankle and joints of right foot: Secondary | ICD-10-CM | POA: Diagnosis not present

## 2017-03-25 DIAGNOSIS — Z6828 Body mass index (BMI) 28.0-28.9, adult: Secondary | ICD-10-CM | POA: Diagnosis not present

## 2017-03-25 DIAGNOSIS — M5416 Radiculopathy, lumbar region: Secondary | ICD-10-CM | POA: Diagnosis not present

## 2017-03-25 DIAGNOSIS — J3089 Other allergic rhinitis: Secondary | ICD-10-CM | POA: Diagnosis not present

## 2017-03-25 DIAGNOSIS — J301 Allergic rhinitis due to pollen: Secondary | ICD-10-CM | POA: Diagnosis not present

## 2017-03-25 DIAGNOSIS — J3081 Allergic rhinitis due to animal (cat) (dog) hair and dander: Secondary | ICD-10-CM | POA: Diagnosis not present

## 2017-03-25 DIAGNOSIS — G8929 Other chronic pain: Secondary | ICD-10-CM | POA: Diagnosis not present

## 2017-04-04 DIAGNOSIS — J301 Allergic rhinitis due to pollen: Secondary | ICD-10-CM | POA: Diagnosis not present

## 2017-04-07 DIAGNOSIS — J3089 Other allergic rhinitis: Secondary | ICD-10-CM | POA: Diagnosis not present

## 2017-04-07 DIAGNOSIS — J3081 Allergic rhinitis due to animal (cat) (dog) hair and dander: Secondary | ICD-10-CM | POA: Diagnosis not present

## 2017-04-07 DIAGNOSIS — J301 Allergic rhinitis due to pollen: Secondary | ICD-10-CM | POA: Diagnosis not present

## 2017-04-09 DIAGNOSIS — F4320 Adjustment disorder, unspecified: Secondary | ICD-10-CM | POA: Diagnosis not present

## 2017-04-15 DIAGNOSIS — J3089 Other allergic rhinitis: Secondary | ICD-10-CM | POA: Diagnosis not present

## 2017-04-15 DIAGNOSIS — J3081 Allergic rhinitis due to animal (cat) (dog) hair and dander: Secondary | ICD-10-CM | POA: Diagnosis not present

## 2017-04-15 DIAGNOSIS — J301 Allergic rhinitis due to pollen: Secondary | ICD-10-CM | POA: Diagnosis not present

## 2017-04-16 DIAGNOSIS — Z79899 Other long term (current) drug therapy: Secondary | ICD-10-CM | POA: Diagnosis not present

## 2017-04-16 DIAGNOSIS — K91850 Pouchitis: Secondary | ICD-10-CM | POA: Diagnosis not present

## 2017-04-16 DIAGNOSIS — Z6827 Body mass index (BMI) 27.0-27.9, adult: Secondary | ICD-10-CM | POA: Diagnosis not present

## 2017-04-16 DIAGNOSIS — R252 Cramp and spasm: Secondary | ICD-10-CM | POA: Diagnosis not present

## 2017-04-16 DIAGNOSIS — E559 Vitamin D deficiency, unspecified: Secondary | ICD-10-CM | POA: Diagnosis not present

## 2017-04-17 DIAGNOSIS — J3089 Other allergic rhinitis: Secondary | ICD-10-CM | POA: Diagnosis not present

## 2017-04-17 DIAGNOSIS — J3081 Allergic rhinitis due to animal (cat) (dog) hair and dander: Secondary | ICD-10-CM | POA: Diagnosis not present

## 2017-04-18 DIAGNOSIS — F3342 Major depressive disorder, recurrent, in full remission: Secondary | ICD-10-CM | POA: Insufficient documentation

## 2017-04-18 DIAGNOSIS — Z1159 Encounter for screening for other viral diseases: Secondary | ICD-10-CM | POA: Diagnosis not present

## 2017-04-18 DIAGNOSIS — T8484XD Pain due to internal orthopedic prosthetic devices, implants and grafts, subsequent encounter: Secondary | ICD-10-CM | POA: Diagnosis not present

## 2017-04-18 DIAGNOSIS — R05 Cough: Secondary | ICD-10-CM | POA: Diagnosis not present

## 2017-04-18 DIAGNOSIS — R499 Unspecified voice and resonance disorder: Secondary | ICD-10-CM | POA: Diagnosis not present

## 2017-04-18 DIAGNOSIS — M71571 Other bursitis, not elsewhere classified, right ankle and foot: Secondary | ICD-10-CM | POA: Diagnosis not present

## 2017-04-18 DIAGNOSIS — M65871 Other synovitis and tenosynovitis, right ankle and foot: Secondary | ICD-10-CM | POA: Diagnosis not present

## 2017-04-22 DIAGNOSIS — R0982 Postnasal drip: Secondary | ICD-10-CM | POA: Diagnosis not present

## 2017-04-22 DIAGNOSIS — K219 Gastro-esophageal reflux disease without esophagitis: Secondary | ICD-10-CM | POA: Diagnosis not present

## 2017-04-22 DIAGNOSIS — J040 Acute laryngitis: Secondary | ICD-10-CM | POA: Diagnosis not present

## 2017-04-23 DIAGNOSIS — J3089 Other allergic rhinitis: Secondary | ICD-10-CM | POA: Diagnosis not present

## 2017-04-23 DIAGNOSIS — J3081 Allergic rhinitis due to animal (cat) (dog) hair and dander: Secondary | ICD-10-CM | POA: Diagnosis not present

## 2017-04-23 DIAGNOSIS — J301 Allergic rhinitis due to pollen: Secondary | ICD-10-CM | POA: Diagnosis not present

## 2017-04-28 DIAGNOSIS — K219 Gastro-esophageal reflux disease without esophagitis: Secondary | ICD-10-CM | POA: Diagnosis not present

## 2017-04-28 DIAGNOSIS — J040 Acute laryngitis: Secondary | ICD-10-CM | POA: Diagnosis not present

## 2017-04-28 DIAGNOSIS — R0982 Postnasal drip: Secondary | ICD-10-CM | POA: Diagnosis not present

## 2017-04-29 DIAGNOSIS — J3081 Allergic rhinitis due to animal (cat) (dog) hair and dander: Secondary | ICD-10-CM | POA: Diagnosis not present

## 2017-04-29 DIAGNOSIS — J3089 Other allergic rhinitis: Secondary | ICD-10-CM | POA: Diagnosis not present

## 2017-04-29 DIAGNOSIS — J301 Allergic rhinitis due to pollen: Secondary | ICD-10-CM | POA: Diagnosis not present

## 2017-04-30 DIAGNOSIS — F4320 Adjustment disorder, unspecified: Secondary | ICD-10-CM | POA: Diagnosis not present

## 2017-05-01 DIAGNOSIS — J3081 Allergic rhinitis due to animal (cat) (dog) hair and dander: Secondary | ICD-10-CM | POA: Diagnosis not present

## 2017-05-01 DIAGNOSIS — J301 Allergic rhinitis due to pollen: Secondary | ICD-10-CM | POA: Diagnosis not present

## 2017-05-01 DIAGNOSIS — J3089 Other allergic rhinitis: Secondary | ICD-10-CM | POA: Diagnosis not present

## 2017-05-05 DIAGNOSIS — J301 Allergic rhinitis due to pollen: Secondary | ICD-10-CM | POA: Diagnosis not present

## 2017-05-05 DIAGNOSIS — J3081 Allergic rhinitis due to animal (cat) (dog) hair and dander: Secondary | ICD-10-CM | POA: Diagnosis not present

## 2017-05-05 DIAGNOSIS — J3089 Other allergic rhinitis: Secondary | ICD-10-CM | POA: Diagnosis not present

## 2017-05-07 DIAGNOSIS — F4320 Adjustment disorder, unspecified: Secondary | ICD-10-CM | POA: Diagnosis not present

## 2017-05-08 DIAGNOSIS — J3081 Allergic rhinitis due to animal (cat) (dog) hair and dander: Secondary | ICD-10-CM | POA: Diagnosis not present

## 2017-05-08 DIAGNOSIS — J301 Allergic rhinitis due to pollen: Secondary | ICD-10-CM | POA: Diagnosis not present

## 2017-05-08 DIAGNOSIS — J3089 Other allergic rhinitis: Secondary | ICD-10-CM | POA: Diagnosis not present

## 2017-05-09 DIAGNOSIS — M65871 Other synovitis and tenosynovitis, right ankle and foot: Secondary | ICD-10-CM | POA: Diagnosis not present

## 2017-05-09 DIAGNOSIS — M71571 Other bursitis, not elsewhere classified, right ankle and foot: Secondary | ICD-10-CM | POA: Diagnosis not present

## 2017-05-12 DIAGNOSIS — M5416 Radiculopathy, lumbar region: Secondary | ICD-10-CM | POA: Diagnosis not present

## 2017-05-13 DIAGNOSIS — J3081 Allergic rhinitis due to animal (cat) (dog) hair and dander: Secondary | ICD-10-CM | POA: Diagnosis not present

## 2017-05-13 DIAGNOSIS — J3089 Other allergic rhinitis: Secondary | ICD-10-CM | POA: Diagnosis not present

## 2017-05-13 DIAGNOSIS — J301 Allergic rhinitis due to pollen: Secondary | ICD-10-CM | POA: Diagnosis not present

## 2017-05-15 DIAGNOSIS — J3081 Allergic rhinitis due to animal (cat) (dog) hair and dander: Secondary | ICD-10-CM | POA: Diagnosis not present

## 2017-05-15 DIAGNOSIS — J3089 Other allergic rhinitis: Secondary | ICD-10-CM | POA: Diagnosis not present

## 2017-05-15 DIAGNOSIS — J301 Allergic rhinitis due to pollen: Secondary | ICD-10-CM | POA: Diagnosis not present

## 2017-05-20 DIAGNOSIS — J3089 Other allergic rhinitis: Secondary | ICD-10-CM | POA: Diagnosis not present

## 2017-05-20 DIAGNOSIS — J301 Allergic rhinitis due to pollen: Secondary | ICD-10-CM | POA: Diagnosis not present

## 2017-05-20 DIAGNOSIS — J3081 Allergic rhinitis due to animal (cat) (dog) hair and dander: Secondary | ICD-10-CM | POA: Diagnosis not present

## 2017-05-21 DIAGNOSIS — F4320 Adjustment disorder, unspecified: Secondary | ICD-10-CM | POA: Diagnosis not present

## 2017-05-21 DIAGNOSIS — T8484XD Pain due to internal orthopedic prosthetic devices, implants and grafts, subsequent encounter: Secondary | ICD-10-CM | POA: Diagnosis not present

## 2017-05-21 DIAGNOSIS — Z79899 Other long term (current) drug therapy: Secondary | ICD-10-CM | POA: Diagnosis not present

## 2017-05-21 DIAGNOSIS — M71571 Other bursitis, not elsewhere classified, right ankle and foot: Secondary | ICD-10-CM | POA: Diagnosis not present

## 2017-05-21 DIAGNOSIS — Z01812 Encounter for preprocedural laboratory examination: Secondary | ICD-10-CM | POA: Diagnosis not present

## 2017-05-22 DIAGNOSIS — J301 Allergic rhinitis due to pollen: Secondary | ICD-10-CM | POA: Diagnosis not present

## 2017-05-27 DIAGNOSIS — T8484XD Pain due to internal orthopedic prosthetic devices, implants and grafts, subsequent encounter: Secondary | ICD-10-CM | POA: Diagnosis not present

## 2017-05-27 DIAGNOSIS — T8484XA Pain due to internal orthopedic prosthetic devices, implants and grafts, initial encounter: Secondary | ICD-10-CM | POA: Diagnosis not present

## 2017-05-30 DIAGNOSIS — M12271 Villonodular synovitis (pigmented), right ankle and foot: Secondary | ICD-10-CM | POA: Diagnosis not present

## 2017-06-02 DIAGNOSIS — J3089 Other allergic rhinitis: Secondary | ICD-10-CM | POA: Diagnosis not present

## 2017-06-02 DIAGNOSIS — J040 Acute laryngitis: Secondary | ICD-10-CM | POA: Diagnosis not present

## 2017-06-02 DIAGNOSIS — J3081 Allergic rhinitis due to animal (cat) (dog) hair and dander: Secondary | ICD-10-CM | POA: Diagnosis not present

## 2017-06-02 DIAGNOSIS — J301 Allergic rhinitis due to pollen: Secondary | ICD-10-CM | POA: Diagnosis not present

## 2017-06-02 DIAGNOSIS — R05 Cough: Secondary | ICD-10-CM | POA: Diagnosis not present

## 2017-06-02 DIAGNOSIS — K219 Gastro-esophageal reflux disease without esophagitis: Secondary | ICD-10-CM | POA: Diagnosis not present

## 2017-06-02 DIAGNOSIS — R0982 Postnasal drip: Secondary | ICD-10-CM | POA: Diagnosis not present

## 2017-06-06 DIAGNOSIS — J3089 Other allergic rhinitis: Secondary | ICD-10-CM | POA: Diagnosis not present

## 2017-06-06 DIAGNOSIS — J301 Allergic rhinitis due to pollen: Secondary | ICD-10-CM | POA: Diagnosis not present

## 2017-06-06 DIAGNOSIS — J3081 Allergic rhinitis due to animal (cat) (dog) hair and dander: Secondary | ICD-10-CM | POA: Diagnosis not present

## 2017-06-10 DIAGNOSIS — J301 Allergic rhinitis due to pollen: Secondary | ICD-10-CM | POA: Diagnosis not present

## 2017-06-10 DIAGNOSIS — J3089 Other allergic rhinitis: Secondary | ICD-10-CM | POA: Diagnosis not present

## 2017-06-10 DIAGNOSIS — J3081 Allergic rhinitis due to animal (cat) (dog) hair and dander: Secondary | ICD-10-CM | POA: Diagnosis not present

## 2017-06-11 DIAGNOSIS — F4320 Adjustment disorder, unspecified: Secondary | ICD-10-CM | POA: Diagnosis not present

## 2017-06-12 DIAGNOSIS — M12271 Villonodular synovitis (pigmented), right ankle and foot: Secondary | ICD-10-CM | POA: Diagnosis not present

## 2017-06-16 DIAGNOSIS — E663 Overweight: Secondary | ICD-10-CM | POA: Diagnosis not present

## 2017-06-16 DIAGNOSIS — G43109 Migraine with aura, not intractable, without status migrainosus: Secondary | ICD-10-CM | POA: Diagnosis not present

## 2017-06-16 DIAGNOSIS — F321 Major depressive disorder, single episode, moderate: Secondary | ICD-10-CM | POA: Diagnosis not present

## 2017-06-17 DIAGNOSIS — J3089 Other allergic rhinitis: Secondary | ICD-10-CM | POA: Diagnosis not present

## 2017-06-17 DIAGNOSIS — J301 Allergic rhinitis due to pollen: Secondary | ICD-10-CM | POA: Diagnosis not present

## 2017-06-17 DIAGNOSIS — J3081 Allergic rhinitis due to animal (cat) (dog) hair and dander: Secondary | ICD-10-CM | POA: Diagnosis not present

## 2017-06-19 DIAGNOSIS — R51 Headache: Secondary | ICD-10-CM | POA: Diagnosis not present

## 2017-06-19 DIAGNOSIS — G43109 Migraine with aura, not intractable, without status migrainosus: Secondary | ICD-10-CM | POA: Diagnosis not present

## 2017-07-01 DIAGNOSIS — J3081 Allergic rhinitis due to animal (cat) (dog) hair and dander: Secondary | ICD-10-CM | POA: Diagnosis not present

## 2017-07-01 DIAGNOSIS — J301 Allergic rhinitis due to pollen: Secondary | ICD-10-CM | POA: Diagnosis not present

## 2017-07-01 DIAGNOSIS — J3089 Other allergic rhinitis: Secondary | ICD-10-CM | POA: Diagnosis not present

## 2017-07-08 DIAGNOSIS — J3081 Allergic rhinitis due to animal (cat) (dog) hair and dander: Secondary | ICD-10-CM | POA: Diagnosis not present

## 2017-07-08 DIAGNOSIS — R0982 Postnasal drip: Secondary | ICD-10-CM | POA: Diagnosis not present

## 2017-07-08 DIAGNOSIS — R490 Dysphonia: Secondary | ICD-10-CM | POA: Diagnosis not present

## 2017-07-08 DIAGNOSIS — J301 Allergic rhinitis due to pollen: Secondary | ICD-10-CM | POA: Diagnosis not present

## 2017-07-08 DIAGNOSIS — J309 Allergic rhinitis, unspecified: Secondary | ICD-10-CM | POA: Diagnosis not present

## 2017-07-08 DIAGNOSIS — J3089 Other allergic rhinitis: Secondary | ICD-10-CM | POA: Diagnosis not present

## 2017-07-09 DIAGNOSIS — F4320 Adjustment disorder, unspecified: Secondary | ICD-10-CM | POA: Diagnosis not present

## 2017-07-15 DIAGNOSIS — J301 Allergic rhinitis due to pollen: Secondary | ICD-10-CM | POA: Diagnosis not present

## 2017-07-15 DIAGNOSIS — J3089 Other allergic rhinitis: Secondary | ICD-10-CM | POA: Diagnosis not present

## 2017-07-15 DIAGNOSIS — J3081 Allergic rhinitis due to animal (cat) (dog) hair and dander: Secondary | ICD-10-CM | POA: Diagnosis not present

## 2017-07-23 DIAGNOSIS — F4320 Adjustment disorder, unspecified: Secondary | ICD-10-CM | POA: Diagnosis not present

## 2017-07-25 DIAGNOSIS — J3081 Allergic rhinitis due to animal (cat) (dog) hair and dander: Secondary | ICD-10-CM | POA: Diagnosis not present

## 2017-07-25 DIAGNOSIS — J3089 Other allergic rhinitis: Secondary | ICD-10-CM | POA: Diagnosis not present

## 2017-07-25 DIAGNOSIS — J301 Allergic rhinitis due to pollen: Secondary | ICD-10-CM | POA: Diagnosis not present

## 2017-07-29 DIAGNOSIS — J301 Allergic rhinitis due to pollen: Secondary | ICD-10-CM | POA: Diagnosis not present

## 2017-07-29 DIAGNOSIS — J3089 Other allergic rhinitis: Secondary | ICD-10-CM | POA: Diagnosis not present

## 2017-07-29 DIAGNOSIS — J3081 Allergic rhinitis due to animal (cat) (dog) hair and dander: Secondary | ICD-10-CM | POA: Diagnosis not present

## 2017-08-01 DIAGNOSIS — J301 Allergic rhinitis due to pollen: Secondary | ICD-10-CM | POA: Diagnosis not present

## 2017-08-13 DIAGNOSIS — J301 Allergic rhinitis due to pollen: Secondary | ICD-10-CM | POA: Diagnosis not present

## 2017-08-13 DIAGNOSIS — J3081 Allergic rhinitis due to animal (cat) (dog) hair and dander: Secondary | ICD-10-CM | POA: Diagnosis not present

## 2017-08-13 DIAGNOSIS — J3089 Other allergic rhinitis: Secondary | ICD-10-CM | POA: Diagnosis not present

## 2017-08-15 DIAGNOSIS — K51018 Ulcerative (chronic) pancolitis with other complication: Secondary | ICD-10-CM | POA: Diagnosis not present

## 2017-08-15 DIAGNOSIS — Z1159 Encounter for screening for other viral diseases: Secondary | ICD-10-CM | POA: Diagnosis not present

## 2017-08-15 DIAGNOSIS — F3342 Major depressive disorder, recurrent, in full remission: Secondary | ICD-10-CM | POA: Diagnosis not present

## 2017-08-15 DIAGNOSIS — K91850 Pouchitis: Secondary | ICD-10-CM | POA: Diagnosis not present

## 2017-08-18 DIAGNOSIS — R7301 Impaired fasting glucose: Secondary | ICD-10-CM | POA: Diagnosis not present

## 2017-08-18 DIAGNOSIS — G629 Polyneuropathy, unspecified: Secondary | ICD-10-CM | POA: Diagnosis not present

## 2017-08-18 DIAGNOSIS — F3342 Major depressive disorder, recurrent, in full remission: Secondary | ICD-10-CM | POA: Diagnosis not present

## 2017-08-18 DIAGNOSIS — Z23 Encounter for immunization: Secondary | ICD-10-CM | POA: Diagnosis not present

## 2017-08-19 DIAGNOSIS — J3081 Allergic rhinitis due to animal (cat) (dog) hair and dander: Secondary | ICD-10-CM | POA: Diagnosis not present

## 2017-08-19 DIAGNOSIS — J3089 Other allergic rhinitis: Secondary | ICD-10-CM | POA: Diagnosis not present

## 2017-08-19 DIAGNOSIS — J301 Allergic rhinitis due to pollen: Secondary | ICD-10-CM | POA: Diagnosis not present

## 2017-08-20 DIAGNOSIS — F4320 Adjustment disorder, unspecified: Secondary | ICD-10-CM | POA: Diagnosis not present

## 2017-08-27 DIAGNOSIS — J3089 Other allergic rhinitis: Secondary | ICD-10-CM | POA: Diagnosis not present

## 2017-08-27 DIAGNOSIS — J301 Allergic rhinitis due to pollen: Secondary | ICD-10-CM | POA: Diagnosis not present

## 2017-08-27 DIAGNOSIS — J3081 Allergic rhinitis due to animal (cat) (dog) hair and dander: Secondary | ICD-10-CM | POA: Diagnosis not present

## 2017-08-29 DIAGNOSIS — J301 Allergic rhinitis due to pollen: Secondary | ICD-10-CM | POA: Diagnosis not present

## 2017-09-02 DIAGNOSIS — J3089 Other allergic rhinitis: Secondary | ICD-10-CM | POA: Diagnosis not present

## 2017-09-02 DIAGNOSIS — J301 Allergic rhinitis due to pollen: Secondary | ICD-10-CM | POA: Diagnosis not present

## 2017-09-02 DIAGNOSIS — J3081 Allergic rhinitis due to animal (cat) (dog) hair and dander: Secondary | ICD-10-CM | POA: Diagnosis not present

## 2017-09-03 DIAGNOSIS — F4320 Adjustment disorder, unspecified: Secondary | ICD-10-CM | POA: Diagnosis not present

## 2017-09-08 DIAGNOSIS — M5416 Radiculopathy, lumbar region: Secondary | ICD-10-CM | POA: Diagnosis not present

## 2017-09-11 DIAGNOSIS — J3489 Other specified disorders of nose and nasal sinuses: Secondary | ICD-10-CM | POA: Diagnosis not present

## 2017-09-11 DIAGNOSIS — J342 Deviated nasal septum: Secondary | ICD-10-CM | POA: Diagnosis not present

## 2017-09-11 DIAGNOSIS — J343 Hypertrophy of nasal turbinates: Secondary | ICD-10-CM | POA: Diagnosis not present

## 2017-09-11 DIAGNOSIS — J301 Allergic rhinitis due to pollen: Secondary | ICD-10-CM | POA: Diagnosis not present

## 2017-09-11 DIAGNOSIS — J329 Chronic sinusitis, unspecified: Secondary | ICD-10-CM | POA: Diagnosis not present

## 2017-09-11 DIAGNOSIS — J3081 Allergic rhinitis due to animal (cat) (dog) hair and dander: Secondary | ICD-10-CM | POA: Diagnosis not present

## 2017-09-11 DIAGNOSIS — J320 Chronic maxillary sinusitis: Secondary | ICD-10-CM | POA: Diagnosis not present

## 2017-09-11 DIAGNOSIS — J3089 Other allergic rhinitis: Secondary | ICD-10-CM | POA: Diagnosis not present

## 2017-09-15 DIAGNOSIS — J301 Allergic rhinitis due to pollen: Secondary | ICD-10-CM | POA: Diagnosis not present

## 2017-09-15 DIAGNOSIS — J3089 Other allergic rhinitis: Secondary | ICD-10-CM | POA: Diagnosis not present

## 2017-09-15 DIAGNOSIS — J3081 Allergic rhinitis due to animal (cat) (dog) hair and dander: Secondary | ICD-10-CM | POA: Diagnosis not present

## 2017-09-15 DIAGNOSIS — G43109 Migraine with aura, not intractable, without status migrainosus: Secondary | ICD-10-CM | POA: Diagnosis not present

## 2017-09-30 DIAGNOSIS — J301 Allergic rhinitis due to pollen: Secondary | ICD-10-CM | POA: Diagnosis not present

## 2017-09-30 DIAGNOSIS — J3081 Allergic rhinitis due to animal (cat) (dog) hair and dander: Secondary | ICD-10-CM | POA: Diagnosis not present

## 2017-09-30 DIAGNOSIS — J3089 Other allergic rhinitis: Secondary | ICD-10-CM | POA: Diagnosis not present

## 2017-10-02 DIAGNOSIS — J3081 Allergic rhinitis due to animal (cat) (dog) hair and dander: Secondary | ICD-10-CM | POA: Diagnosis not present

## 2017-10-02 DIAGNOSIS — J3089 Other allergic rhinitis: Secondary | ICD-10-CM | POA: Diagnosis not present

## 2017-10-07 DIAGNOSIS — J3081 Allergic rhinitis due to animal (cat) (dog) hair and dander: Secondary | ICD-10-CM | POA: Diagnosis not present

## 2017-10-07 DIAGNOSIS — J3089 Other allergic rhinitis: Secondary | ICD-10-CM | POA: Diagnosis not present

## 2017-10-07 DIAGNOSIS — J301 Allergic rhinitis due to pollen: Secondary | ICD-10-CM | POA: Diagnosis not present

## 2017-10-10 ENCOUNTER — Other Ambulatory Visit: Payer: Self-pay | Admitting: Allergy

## 2017-10-10 ENCOUNTER — Ambulatory Visit
Admission: RE | Admit: 2017-10-10 | Discharge: 2017-10-10 | Disposition: A | Discharge status: Home / Self Care | Payer: BLUE CROSS/BLUE SHIELD | Source: Ambulatory Visit | Attending: Allergy | Admitting: Allergy

## 2017-10-10 DIAGNOSIS — R059 Cough, unspecified: Secondary | ICD-10-CM

## 2017-10-10 DIAGNOSIS — J329 Chronic sinusitis, unspecified: Secondary | ICD-10-CM | POA: Diagnosis not present

## 2017-10-10 DIAGNOSIS — R05 Cough: Secondary | ICD-10-CM

## 2017-10-10 DIAGNOSIS — Z23 Encounter for immunization: Secondary | ICD-10-CM | POA: Diagnosis not present

## 2017-10-10 DIAGNOSIS — J301 Allergic rhinitis due to pollen: Secondary | ICD-10-CM | POA: Diagnosis not present

## 2017-10-10 DIAGNOSIS — J3089 Other allergic rhinitis: Secondary | ICD-10-CM | POA: Diagnosis not present

## 2017-10-10 DIAGNOSIS — J3081 Allergic rhinitis due to animal (cat) (dog) hair and dander: Secondary | ICD-10-CM | POA: Diagnosis not present

## 2017-10-15 DIAGNOSIS — Z6825 Body mass index (BMI) 25.0-25.9, adult: Secondary | ICD-10-CM | POA: Diagnosis not present

## 2017-10-15 DIAGNOSIS — J301 Allergic rhinitis due to pollen: Secondary | ICD-10-CM | POA: Diagnosis not present

## 2017-10-15 DIAGNOSIS — J3081 Allergic rhinitis due to animal (cat) (dog) hair and dander: Secondary | ICD-10-CM | POA: Diagnosis not present

## 2017-10-15 DIAGNOSIS — J3089 Other allergic rhinitis: Secondary | ICD-10-CM | POA: Diagnosis not present

## 2017-10-15 DIAGNOSIS — M5416 Radiculopathy, lumbar region: Secondary | ICD-10-CM | POA: Diagnosis not present

## 2017-10-16 DIAGNOSIS — R0982 Postnasal drip: Secondary | ICD-10-CM | POA: Diagnosis not present

## 2017-10-16 DIAGNOSIS — R05 Cough: Secondary | ICD-10-CM | POA: Diagnosis not present

## 2017-10-16 DIAGNOSIS — K219 Gastro-esophageal reflux disease without esophagitis: Secondary | ICD-10-CM | POA: Diagnosis not present

## 2017-10-23 DIAGNOSIS — R05 Cough: Secondary | ICD-10-CM | POA: Diagnosis not present

## 2017-10-23 DIAGNOSIS — J309 Allergic rhinitis, unspecified: Secondary | ICD-10-CM | POA: Diagnosis not present

## 2017-10-23 DIAGNOSIS — J3089 Other allergic rhinitis: Secondary | ICD-10-CM | POA: Diagnosis not present

## 2017-10-23 DIAGNOSIS — J3081 Allergic rhinitis due to animal (cat) (dog) hair and dander: Secondary | ICD-10-CM | POA: Diagnosis not present

## 2017-10-23 DIAGNOSIS — J329 Chronic sinusitis, unspecified: Secondary | ICD-10-CM | POA: Diagnosis not present

## 2017-10-23 DIAGNOSIS — J45909 Unspecified asthma, uncomplicated: Secondary | ICD-10-CM | POA: Diagnosis not present

## 2017-10-23 DIAGNOSIS — J301 Allergic rhinitis due to pollen: Secondary | ICD-10-CM | POA: Diagnosis not present

## 2017-10-28 DIAGNOSIS — J3081 Allergic rhinitis due to animal (cat) (dog) hair and dander: Secondary | ICD-10-CM | POA: Diagnosis not present

## 2017-10-28 DIAGNOSIS — J3089 Other allergic rhinitis: Secondary | ICD-10-CM | POA: Diagnosis not present

## 2017-10-28 DIAGNOSIS — J301 Allergic rhinitis due to pollen: Secondary | ICD-10-CM | POA: Diagnosis not present

## 2017-10-30 DIAGNOSIS — J301 Allergic rhinitis due to pollen: Secondary | ICD-10-CM | POA: Diagnosis not present

## 2017-11-19 DIAGNOSIS — J3089 Other allergic rhinitis: Secondary | ICD-10-CM | POA: Diagnosis not present

## 2017-11-19 DIAGNOSIS — J301 Allergic rhinitis due to pollen: Secondary | ICD-10-CM | POA: Diagnosis not present

## 2017-11-19 DIAGNOSIS — J3081 Allergic rhinitis due to animal (cat) (dog) hair and dander: Secondary | ICD-10-CM | POA: Diagnosis not present

## 2017-11-27 DIAGNOSIS — J3081 Allergic rhinitis due to animal (cat) (dog) hair and dander: Secondary | ICD-10-CM | POA: Diagnosis not present

## 2017-11-27 DIAGNOSIS — J3089 Other allergic rhinitis: Secondary | ICD-10-CM | POA: Diagnosis not present

## 2017-11-28 DIAGNOSIS — J3081 Allergic rhinitis due to animal (cat) (dog) hair and dander: Secondary | ICD-10-CM | POA: Diagnosis not present

## 2017-11-28 DIAGNOSIS — J3089 Other allergic rhinitis: Secondary | ICD-10-CM | POA: Diagnosis not present

## 2017-11-28 DIAGNOSIS — J301 Allergic rhinitis due to pollen: Secondary | ICD-10-CM | POA: Diagnosis not present

## 2017-12-02 DIAGNOSIS — J3089 Other allergic rhinitis: Secondary | ICD-10-CM | POA: Diagnosis not present

## 2017-12-02 DIAGNOSIS — J301 Allergic rhinitis due to pollen: Secondary | ICD-10-CM | POA: Diagnosis not present

## 2017-12-02 DIAGNOSIS — J3081 Allergic rhinitis due to animal (cat) (dog) hair and dander: Secondary | ICD-10-CM | POA: Diagnosis not present

## 2017-12-09 DIAGNOSIS — M5416 Radiculopathy, lumbar region: Secondary | ICD-10-CM | POA: Diagnosis not present

## 2017-12-11 DIAGNOSIS — J3089 Other allergic rhinitis: Secondary | ICD-10-CM | POA: Diagnosis not present

## 2017-12-11 DIAGNOSIS — J301 Allergic rhinitis due to pollen: Secondary | ICD-10-CM | POA: Diagnosis not present

## 2017-12-11 DIAGNOSIS — J3081 Allergic rhinitis due to animal (cat) (dog) hair and dander: Secondary | ICD-10-CM | POA: Diagnosis not present

## 2017-12-16 DIAGNOSIS — J301 Allergic rhinitis due to pollen: Secondary | ICD-10-CM | POA: Diagnosis not present

## 2017-12-16 DIAGNOSIS — J3081 Allergic rhinitis due to animal (cat) (dog) hair and dander: Secondary | ICD-10-CM | POA: Diagnosis not present

## 2017-12-16 DIAGNOSIS — J3089 Other allergic rhinitis: Secondary | ICD-10-CM | POA: Diagnosis not present

## 2017-12-22 DIAGNOSIS — R319 Hematuria, unspecified: Secondary | ICD-10-CM | POA: Diagnosis not present

## 2017-12-22 DIAGNOSIS — Z01419 Encounter for gynecological examination (general) (routine) without abnormal findings: Secondary | ICD-10-CM | POA: Diagnosis not present

## 2017-12-22 DIAGNOSIS — Z1231 Encounter for screening mammogram for malignant neoplasm of breast: Secondary | ICD-10-CM | POA: Diagnosis not present

## 2017-12-22 DIAGNOSIS — Z6827 Body mass index (BMI) 27.0-27.9, adult: Secondary | ICD-10-CM | POA: Diagnosis not present

## 2017-12-22 DIAGNOSIS — N760 Acute vaginitis: Secondary | ICD-10-CM | POA: Diagnosis not present

## 2017-12-24 DIAGNOSIS — J3081 Allergic rhinitis due to animal (cat) (dog) hair and dander: Secondary | ICD-10-CM | POA: Diagnosis not present

## 2017-12-24 DIAGNOSIS — J301 Allergic rhinitis due to pollen: Secondary | ICD-10-CM | POA: Diagnosis not present

## 2017-12-24 DIAGNOSIS — J3089 Other allergic rhinitis: Secondary | ICD-10-CM | POA: Diagnosis not present

## 2017-12-29 DIAGNOSIS — R05 Cough: Secondary | ICD-10-CM | POA: Diagnosis not present

## 2017-12-29 DIAGNOSIS — R0989 Other specified symptoms and signs involving the circulatory and respiratory systems: Secondary | ICD-10-CM | POA: Diagnosis not present

## 2017-12-30 DIAGNOSIS — J3081 Allergic rhinitis due to animal (cat) (dog) hair and dander: Secondary | ICD-10-CM | POA: Diagnosis not present

## 2017-12-30 DIAGNOSIS — J3089 Other allergic rhinitis: Secondary | ICD-10-CM | POA: Diagnosis not present

## 2017-12-30 DIAGNOSIS — J301 Allergic rhinitis due to pollen: Secondary | ICD-10-CM | POA: Diagnosis not present

## 2018-01-05 DIAGNOSIS — K219 Gastro-esophageal reflux disease without esophagitis: Secondary | ICD-10-CM | POA: Diagnosis not present

## 2018-01-05 DIAGNOSIS — R05 Cough: Secondary | ICD-10-CM | POA: Diagnosis not present

## 2018-01-05 DIAGNOSIS — J309 Allergic rhinitis, unspecified: Secondary | ICD-10-CM | POA: Diagnosis not present

## 2018-01-06 DIAGNOSIS — J3081 Allergic rhinitis due to animal (cat) (dog) hair and dander: Secondary | ICD-10-CM | POA: Diagnosis not present

## 2018-01-06 DIAGNOSIS — J301 Allergic rhinitis due to pollen: Secondary | ICD-10-CM | POA: Diagnosis not present

## 2018-01-06 DIAGNOSIS — J3089 Other allergic rhinitis: Secondary | ICD-10-CM | POA: Diagnosis not present

## 2018-01-07 DIAGNOSIS — R942 Abnormal results of pulmonary function studies: Secondary | ICD-10-CM | POA: Diagnosis not present

## 2018-01-07 DIAGNOSIS — R05 Cough: Secondary | ICD-10-CM | POA: Diagnosis not present

## 2018-01-12 DIAGNOSIS — R05 Cough: Secondary | ICD-10-CM | POA: Diagnosis not present

## 2018-01-12 DIAGNOSIS — J9811 Atelectasis: Secondary | ICD-10-CM | POA: Diagnosis not present

## 2018-01-13 DIAGNOSIS — F3342 Major depressive disorder, recurrent, in full remission: Secondary | ICD-10-CM | POA: Diagnosis not present

## 2018-01-13 DIAGNOSIS — Z885 Allergy status to narcotic agent status: Secondary | ICD-10-CM | POA: Diagnosis not present

## 2018-01-13 DIAGNOSIS — Z79899 Other long term (current) drug therapy: Secondary | ICD-10-CM | POA: Diagnosis not present

## 2018-01-13 DIAGNOSIS — Z91040 Latex allergy status: Secondary | ICD-10-CM | POA: Diagnosis not present

## 2018-01-13 DIAGNOSIS — G629 Polyneuropathy, unspecified: Secondary | ICD-10-CM | POA: Diagnosis not present

## 2018-01-13 DIAGNOSIS — Z7951 Long term (current) use of inhaled steroids: Secondary | ICD-10-CM | POA: Diagnosis not present

## 2018-01-13 DIAGNOSIS — K5190 Ulcerative colitis, unspecified, without complications: Secondary | ICD-10-CM | POA: Diagnosis not present

## 2018-01-13 DIAGNOSIS — J309 Allergic rhinitis, unspecified: Secondary | ICD-10-CM | POA: Diagnosis not present

## 2018-01-13 DIAGNOSIS — R05 Cough: Secondary | ICD-10-CM | POA: Diagnosis not present

## 2018-01-13 DIAGNOSIS — J189 Pneumonia, unspecified organism: Secondary | ICD-10-CM | POA: Diagnosis not present

## 2018-01-13 DIAGNOSIS — J45909 Unspecified asthma, uncomplicated: Secondary | ICD-10-CM | POA: Diagnosis not present

## 2018-01-13 DIAGNOSIS — K219 Gastro-esophageal reflux disease without esophagitis: Secondary | ICD-10-CM | POA: Diagnosis not present

## 2018-01-15 DIAGNOSIS — F321 Major depressive disorder, single episode, moderate: Secondary | ICD-10-CM | POA: Diagnosis not present

## 2018-01-15 DIAGNOSIS — J301 Allergic rhinitis due to pollen: Secondary | ICD-10-CM | POA: Diagnosis not present

## 2018-01-15 DIAGNOSIS — G43109 Migraine with aura, not intractable, without status migrainosus: Secondary | ICD-10-CM | POA: Diagnosis not present

## 2018-01-15 DIAGNOSIS — J3081 Allergic rhinitis due to animal (cat) (dog) hair and dander: Secondary | ICD-10-CM | POA: Diagnosis not present

## 2018-01-15 DIAGNOSIS — J3089 Other allergic rhinitis: Secondary | ICD-10-CM | POA: Diagnosis not present

## 2018-01-26 DIAGNOSIS — J3081 Allergic rhinitis due to animal (cat) (dog) hair and dander: Secondary | ICD-10-CM | POA: Diagnosis not present

## 2018-01-26 DIAGNOSIS — J301 Allergic rhinitis due to pollen: Secondary | ICD-10-CM | POA: Diagnosis not present

## 2018-01-26 DIAGNOSIS — J3089 Other allergic rhinitis: Secondary | ICD-10-CM | POA: Diagnosis not present

## 2018-02-02 DIAGNOSIS — J301 Allergic rhinitis due to pollen: Secondary | ICD-10-CM | POA: Diagnosis not present

## 2018-02-02 DIAGNOSIS — J3081 Allergic rhinitis due to animal (cat) (dog) hair and dander: Secondary | ICD-10-CM | POA: Diagnosis not present

## 2018-02-02 DIAGNOSIS — J3089 Other allergic rhinitis: Secondary | ICD-10-CM | POA: Diagnosis not present

## 2018-02-09 DIAGNOSIS — J3089 Other allergic rhinitis: Secondary | ICD-10-CM | POA: Diagnosis not present

## 2018-02-09 DIAGNOSIS — J3081 Allergic rhinitis due to animal (cat) (dog) hair and dander: Secondary | ICD-10-CM | POA: Diagnosis not present

## 2018-02-09 DIAGNOSIS — J301 Allergic rhinitis due to pollen: Secondary | ICD-10-CM | POA: Diagnosis not present

## 2018-02-11 DIAGNOSIS — K219 Gastro-esophageal reflux disease without esophagitis: Secondary | ICD-10-CM | POA: Diagnosis not present

## 2018-02-11 DIAGNOSIS — J45909 Unspecified asthma, uncomplicated: Secondary | ICD-10-CM | POA: Diagnosis not present

## 2018-02-11 DIAGNOSIS — R05 Cough: Secondary | ICD-10-CM | POA: Diagnosis not present

## 2018-02-11 DIAGNOSIS — J329 Chronic sinusitis, unspecified: Secondary | ICD-10-CM | POA: Diagnosis not present

## 2018-02-12 DIAGNOSIS — J3081 Allergic rhinitis due to animal (cat) (dog) hair and dander: Secondary | ICD-10-CM | POA: Diagnosis not present

## 2018-02-12 DIAGNOSIS — J3089 Other allergic rhinitis: Secondary | ICD-10-CM | POA: Diagnosis not present

## 2018-02-13 DIAGNOSIS — K91850 Pouchitis: Secondary | ICD-10-CM | POA: Diagnosis not present

## 2018-02-13 DIAGNOSIS — R05 Cough: Secondary | ICD-10-CM | POA: Diagnosis not present

## 2018-02-13 DIAGNOSIS — M5416 Radiculopathy, lumbar region: Secondary | ICD-10-CM | POA: Diagnosis not present

## 2018-02-13 DIAGNOSIS — R109 Unspecified abdominal pain: Secondary | ICD-10-CM | POA: Diagnosis not present

## 2018-02-18 DIAGNOSIS — K51018 Ulcerative (chronic) pancolitis with other complication: Secondary | ICD-10-CM | POA: Diagnosis not present

## 2018-02-18 DIAGNOSIS — K91850 Pouchitis: Secondary | ICD-10-CM | POA: Diagnosis not present

## 2018-02-18 DIAGNOSIS — R7301 Impaired fasting glucose: Secondary | ICD-10-CM | POA: Diagnosis not present

## 2018-02-18 DIAGNOSIS — F3342 Major depressive disorder, recurrent, in full remission: Secondary | ICD-10-CM | POA: Diagnosis not present

## 2018-02-24 DIAGNOSIS — J301 Allergic rhinitis due to pollen: Secondary | ICD-10-CM | POA: Diagnosis not present

## 2018-02-24 DIAGNOSIS — J3089 Other allergic rhinitis: Secondary | ICD-10-CM | POA: Diagnosis not present

## 2018-02-24 DIAGNOSIS — J3081 Allergic rhinitis due to animal (cat) (dog) hair and dander: Secondary | ICD-10-CM | POA: Diagnosis not present

## 2018-02-25 DIAGNOSIS — Z6824 Body mass index (BMI) 24.0-24.9, adult: Secondary | ICD-10-CM | POA: Diagnosis not present

## 2018-02-25 DIAGNOSIS — K91850 Pouchitis: Secondary | ICD-10-CM | POA: Diagnosis not present

## 2018-03-02 DIAGNOSIS — J301 Allergic rhinitis due to pollen: Secondary | ICD-10-CM | POA: Diagnosis not present

## 2018-03-02 DIAGNOSIS — J3081 Allergic rhinitis due to animal (cat) (dog) hair and dander: Secondary | ICD-10-CM | POA: Diagnosis not present

## 2018-03-02 DIAGNOSIS — J3089 Other allergic rhinitis: Secondary | ICD-10-CM | POA: Diagnosis not present

## 2018-03-04 DIAGNOSIS — K91850 Pouchitis: Secondary | ICD-10-CM | POA: Diagnosis not present

## 2018-03-04 DIAGNOSIS — R109 Unspecified abdominal pain: Secondary | ICD-10-CM | POA: Diagnosis not present

## 2018-03-04 DIAGNOSIS — K644 Residual hemorrhoidal skin tags: Secondary | ICD-10-CM | POA: Diagnosis not present

## 2018-03-04 DIAGNOSIS — K6289 Other specified diseases of anus and rectum: Secondary | ICD-10-CM | POA: Diagnosis not present

## 2018-03-04 DIAGNOSIS — K5190 Ulcerative colitis, unspecified, without complications: Secondary | ICD-10-CM | POA: Diagnosis not present

## 2018-03-04 DIAGNOSIS — Z79899 Other long term (current) drug therapy: Secondary | ICD-10-CM | POA: Diagnosis not present

## 2018-03-04 DIAGNOSIS — Z9049 Acquired absence of other specified parts of digestive tract: Secondary | ICD-10-CM | POA: Diagnosis not present

## 2018-03-04 DIAGNOSIS — Z09 Encounter for follow-up examination after completed treatment for conditions other than malignant neoplasm: Secondary | ICD-10-CM | POA: Diagnosis not present

## 2018-03-17 DIAGNOSIS — J301 Allergic rhinitis due to pollen: Secondary | ICD-10-CM | POA: Diagnosis not present

## 2018-03-17 DIAGNOSIS — J3089 Other allergic rhinitis: Secondary | ICD-10-CM | POA: Diagnosis not present

## 2018-03-17 DIAGNOSIS — J3081 Allergic rhinitis due to animal (cat) (dog) hair and dander: Secondary | ICD-10-CM | POA: Diagnosis not present

## 2018-03-31 DIAGNOSIS — J3081 Allergic rhinitis due to animal (cat) (dog) hair and dander: Secondary | ICD-10-CM | POA: Diagnosis not present

## 2018-03-31 DIAGNOSIS — J301 Allergic rhinitis due to pollen: Secondary | ICD-10-CM | POA: Diagnosis not present

## 2018-03-31 DIAGNOSIS — J3089 Other allergic rhinitis: Secondary | ICD-10-CM | POA: Diagnosis not present

## 2018-04-01 DIAGNOSIS — K91850 Pouchitis: Secondary | ICD-10-CM | POA: Diagnosis not present

## 2018-04-01 DIAGNOSIS — R1084 Generalized abdominal pain: Secondary | ICD-10-CM | POA: Diagnosis not present

## 2018-04-01 DIAGNOSIS — Z6823 Body mass index (BMI) 23.0-23.9, adult: Secondary | ICD-10-CM | POA: Diagnosis not present

## 2018-04-09 DIAGNOSIS — J3081 Allergic rhinitis due to animal (cat) (dog) hair and dander: Secondary | ICD-10-CM | POA: Diagnosis not present

## 2018-04-09 DIAGNOSIS — G43109 Migraine with aura, not intractable, without status migrainosus: Secondary | ICD-10-CM | POA: Diagnosis not present

## 2018-04-09 DIAGNOSIS — J3089 Other allergic rhinitis: Secondary | ICD-10-CM | POA: Diagnosis not present

## 2018-04-09 DIAGNOSIS — J301 Allergic rhinitis due to pollen: Secondary | ICD-10-CM | POA: Diagnosis not present

## 2018-04-13 ENCOUNTER — Other Ambulatory Visit: Payer: Self-pay | Admitting: Neurosurgery

## 2018-04-13 DIAGNOSIS — M5416 Radiculopathy, lumbar region: Secondary | ICD-10-CM

## 2018-04-14 ENCOUNTER — Telehealth: Payer: Self-pay | Admitting: Nurse Practitioner

## 2018-04-14 DIAGNOSIS — J3081 Allergic rhinitis due to animal (cat) (dog) hair and dander: Secondary | ICD-10-CM | POA: Diagnosis not present

## 2018-04-14 DIAGNOSIS — J3089 Other allergic rhinitis: Secondary | ICD-10-CM | POA: Diagnosis not present

## 2018-04-14 DIAGNOSIS — J301 Allergic rhinitis due to pollen: Secondary | ICD-10-CM | POA: Diagnosis not present

## 2018-04-14 NOTE — Telephone Encounter (Signed)
Phone call to patient to verify medication list and allergies for myelogram procedure. Pt instructed to hold Lexapro for 48hrs prior to myelogram appointment time. Pt verbalized understanding. Pt reports she is no longer taking abilify.

## 2018-04-16 DIAGNOSIS — J301 Allergic rhinitis due to pollen: Secondary | ICD-10-CM | POA: Diagnosis not present

## 2018-04-16 DIAGNOSIS — J3089 Other allergic rhinitis: Secondary | ICD-10-CM | POA: Diagnosis not present

## 2018-04-16 DIAGNOSIS — J3081 Allergic rhinitis due to animal (cat) (dog) hair and dander: Secondary | ICD-10-CM | POA: Diagnosis not present

## 2018-04-20 DIAGNOSIS — R05 Cough: Secondary | ICD-10-CM | POA: Diagnosis not present

## 2018-04-20 DIAGNOSIS — J3089 Other allergic rhinitis: Secondary | ICD-10-CM | POA: Diagnosis not present

## 2018-04-20 DIAGNOSIS — J301 Allergic rhinitis due to pollen: Secondary | ICD-10-CM | POA: Diagnosis not present

## 2018-04-20 DIAGNOSIS — R6889 Other general symptoms and signs: Secondary | ICD-10-CM | POA: Diagnosis not present

## 2018-04-20 DIAGNOSIS — R509 Fever, unspecified: Secondary | ICD-10-CM | POA: Diagnosis not present

## 2018-04-20 DIAGNOSIS — J029 Acute pharyngitis, unspecified: Secondary | ICD-10-CM | POA: Diagnosis not present

## 2018-04-20 DIAGNOSIS — J3081 Allergic rhinitis due to animal (cat) (dog) hair and dander: Secondary | ICD-10-CM | POA: Diagnosis not present

## 2018-04-20 DIAGNOSIS — R5383 Other fatigue: Secondary | ICD-10-CM | POA: Diagnosis not present

## 2018-04-22 DIAGNOSIS — J3089 Other allergic rhinitis: Secondary | ICD-10-CM | POA: Diagnosis not present

## 2018-04-22 DIAGNOSIS — J301 Allergic rhinitis due to pollen: Secondary | ICD-10-CM | POA: Diagnosis not present

## 2018-04-22 DIAGNOSIS — J3081 Allergic rhinitis due to animal (cat) (dog) hair and dander: Secondary | ICD-10-CM | POA: Diagnosis not present

## 2018-04-23 ENCOUNTER — Other Ambulatory Visit: Payer: BLUE CROSS/BLUE SHIELD

## 2018-04-23 ENCOUNTER — Inpatient Hospital Stay
Admission: RE | Admit: 2018-04-23 | Discharge: 2018-04-23 | Disposition: A | Discharge status: Home / Self Care | Payer: BLUE CROSS/BLUE SHIELD | Source: Ambulatory Visit | Attending: Neurosurgery | Admitting: Neurosurgery

## 2018-05-01 DIAGNOSIS — J301 Allergic rhinitis due to pollen: Secondary | ICD-10-CM | POA: Diagnosis not present

## 2018-05-01 DIAGNOSIS — J3081 Allergic rhinitis due to animal (cat) (dog) hair and dander: Secondary | ICD-10-CM | POA: Diagnosis not present

## 2018-05-01 DIAGNOSIS — J3089 Other allergic rhinitis: Secondary | ICD-10-CM | POA: Diagnosis not present

## 2018-05-08 DIAGNOSIS — J3089 Other allergic rhinitis: Secondary | ICD-10-CM | POA: Diagnosis not present

## 2018-05-08 DIAGNOSIS — J301 Allergic rhinitis due to pollen: Secondary | ICD-10-CM | POA: Diagnosis not present

## 2018-05-08 DIAGNOSIS — J3081 Allergic rhinitis due to animal (cat) (dog) hair and dander: Secondary | ICD-10-CM | POA: Diagnosis not present

## 2018-05-13 DIAGNOSIS — J3081 Allergic rhinitis due to animal (cat) (dog) hair and dander: Secondary | ICD-10-CM | POA: Diagnosis not present

## 2018-05-13 DIAGNOSIS — J3089 Other allergic rhinitis: Secondary | ICD-10-CM | POA: Diagnosis not present

## 2018-05-13 DIAGNOSIS — J301 Allergic rhinitis due to pollen: Secondary | ICD-10-CM | POA: Diagnosis not present

## 2018-05-22 DIAGNOSIS — J3081 Allergic rhinitis due to animal (cat) (dog) hair and dander: Secondary | ICD-10-CM | POA: Diagnosis not present

## 2018-05-22 DIAGNOSIS — J3089 Other allergic rhinitis: Secondary | ICD-10-CM | POA: Diagnosis not present

## 2018-05-22 DIAGNOSIS — J301 Allergic rhinitis due to pollen: Secondary | ICD-10-CM | POA: Diagnosis not present

## 2018-05-27 DIAGNOSIS — J3081 Allergic rhinitis due to animal (cat) (dog) hair and dander: Secondary | ICD-10-CM | POA: Diagnosis not present

## 2018-05-27 DIAGNOSIS — J301 Allergic rhinitis due to pollen: Secondary | ICD-10-CM | POA: Diagnosis not present

## 2018-05-27 DIAGNOSIS — J3089 Other allergic rhinitis: Secondary | ICD-10-CM | POA: Diagnosis not present

## 2018-06-05 DIAGNOSIS — J3089 Other allergic rhinitis: Secondary | ICD-10-CM | POA: Diagnosis not present

## 2018-06-05 DIAGNOSIS — J3081 Allergic rhinitis due to animal (cat) (dog) hair and dander: Secondary | ICD-10-CM | POA: Diagnosis not present

## 2018-06-11 DIAGNOSIS — J3089 Other allergic rhinitis: Secondary | ICD-10-CM | POA: Diagnosis not present

## 2018-06-11 DIAGNOSIS — J301 Allergic rhinitis due to pollen: Secondary | ICD-10-CM | POA: Diagnosis not present

## 2018-06-11 DIAGNOSIS — J3081 Allergic rhinitis due to animal (cat) (dog) hair and dander: Secondary | ICD-10-CM | POA: Diagnosis not present

## 2018-06-19 DIAGNOSIS — J3089 Other allergic rhinitis: Secondary | ICD-10-CM | POA: Diagnosis not present

## 2018-06-19 DIAGNOSIS — J301 Allergic rhinitis due to pollen: Secondary | ICD-10-CM | POA: Diagnosis not present

## 2018-06-19 DIAGNOSIS — J3081 Allergic rhinitis due to animal (cat) (dog) hair and dander: Secondary | ICD-10-CM | POA: Diagnosis not present

## 2018-06-24 ENCOUNTER — Telehealth: Payer: Self-pay | Admitting: Nurse Practitioner

## 2018-06-24 NOTE — Telephone Encounter (Signed)
Phone call to patient to verify medication list and allergies for myelogram procedure. Pt instructed to hold Lexapro for 48hrs prior to myelogram appointment time. Pt verbalized understanding. Pre and post procedure instructions reviewed with pt.

## 2018-06-26 DIAGNOSIS — J3081 Allergic rhinitis due to animal (cat) (dog) hair and dander: Secondary | ICD-10-CM | POA: Diagnosis not present

## 2018-06-26 DIAGNOSIS — J301 Allergic rhinitis due to pollen: Secondary | ICD-10-CM | POA: Diagnosis not present

## 2018-06-26 DIAGNOSIS — J3089 Other allergic rhinitis: Secondary | ICD-10-CM | POA: Diagnosis not present

## 2018-06-30 DIAGNOSIS — J3089 Other allergic rhinitis: Secondary | ICD-10-CM | POA: Diagnosis not present

## 2018-06-30 DIAGNOSIS — J301 Allergic rhinitis due to pollen: Secondary | ICD-10-CM | POA: Diagnosis not present

## 2018-06-30 DIAGNOSIS — J3081 Allergic rhinitis due to animal (cat) (dog) hair and dander: Secondary | ICD-10-CM | POA: Diagnosis not present

## 2018-07-02 DIAGNOSIS — K91850 Pouchitis: Secondary | ICD-10-CM | POA: Diagnosis not present

## 2018-07-02 DIAGNOSIS — R1084 Generalized abdominal pain: Secondary | ICD-10-CM | POA: Diagnosis not present

## 2018-07-03 DIAGNOSIS — J3081 Allergic rhinitis due to animal (cat) (dog) hair and dander: Secondary | ICD-10-CM | POA: Diagnosis not present

## 2018-07-03 DIAGNOSIS — J301 Allergic rhinitis due to pollen: Secondary | ICD-10-CM | POA: Diagnosis not present

## 2018-07-03 DIAGNOSIS — J3089 Other allergic rhinitis: Secondary | ICD-10-CM | POA: Diagnosis not present

## 2018-07-06 DIAGNOSIS — J301 Allergic rhinitis due to pollen: Secondary | ICD-10-CM | POA: Diagnosis not present

## 2018-07-06 DIAGNOSIS — J3081 Allergic rhinitis due to animal (cat) (dog) hair and dander: Secondary | ICD-10-CM | POA: Diagnosis not present

## 2018-07-06 DIAGNOSIS — J3089 Other allergic rhinitis: Secondary | ICD-10-CM | POA: Diagnosis not present

## 2018-07-08 DIAGNOSIS — J3081 Allergic rhinitis due to animal (cat) (dog) hair and dander: Secondary | ICD-10-CM | POA: Diagnosis not present

## 2018-07-08 DIAGNOSIS — J301 Allergic rhinitis due to pollen: Secondary | ICD-10-CM | POA: Diagnosis not present

## 2018-07-08 DIAGNOSIS — J3089 Other allergic rhinitis: Secondary | ICD-10-CM | POA: Diagnosis not present

## 2018-07-13 DIAGNOSIS — J3089 Other allergic rhinitis: Secondary | ICD-10-CM | POA: Diagnosis not present

## 2018-07-13 DIAGNOSIS — J301 Allergic rhinitis due to pollen: Secondary | ICD-10-CM | POA: Diagnosis not present

## 2018-07-13 DIAGNOSIS — J3081 Allergic rhinitis due to animal (cat) (dog) hair and dander: Secondary | ICD-10-CM | POA: Diagnosis not present

## 2018-07-14 DIAGNOSIS — Z980 Intestinal bypass and anastomosis status: Secondary | ICD-10-CM | POA: Diagnosis not present

## 2018-07-14 DIAGNOSIS — Z6822 Body mass index (BMI) 22.0-22.9, adult: Secondary | ICD-10-CM | POA: Diagnosis not present

## 2018-07-14 DIAGNOSIS — R159 Full incontinence of feces: Secondary | ICD-10-CM | POA: Diagnosis not present

## 2018-07-20 ENCOUNTER — Ambulatory Visit
Admission: RE | Admit: 2018-07-20 | Discharge: 2018-07-20 | Disposition: A | Discharge status: Home / Self Care | Payer: BC Managed Care – PPO | Source: Ambulatory Visit | Attending: Neurosurgery | Admitting: Neurosurgery

## 2018-07-20 ENCOUNTER — Other Ambulatory Visit: Payer: Self-pay

## 2018-07-20 DIAGNOSIS — M5416 Radiculopathy, lumbar region: Secondary | ICD-10-CM

## 2018-07-20 DIAGNOSIS — M48061 Spinal stenosis, lumbar region without neurogenic claudication: Secondary | ICD-10-CM | POA: Diagnosis not present

## 2018-07-20 MED ORDER — ONDANSETRON HCL 4 MG/2ML IJ SOLN
4.0000 mg | Freq: Once | INTRAMUSCULAR | Status: AC
  Administered 2018-07-20: 09:00:00 4 mg via INTRAMUSCULAR

## 2018-07-20 MED ORDER — DIAZEPAM 5 MG PO TABS
5.0000 mg | ORAL_TABLET | Freq: Once | ORAL | Status: AC
  Administered 2018-07-20: 5 mg via ORAL

## 2018-07-20 MED ORDER — MEPERIDINE HCL 50 MG/ML IJ SOLN
50.0000 mg | Freq: Once | INTRAMUSCULAR | Status: AC
  Administered 2018-07-20: 09:00:00 50 mg via INTRAMUSCULAR

## 2018-07-20 MED ORDER — IOPAMIDOL (ISOVUE-M 200) INJECTION 41%
15.0000 mL | Freq: Once | INTRAMUSCULAR | Status: AC
  Administered 2018-07-20: 09:00:00 15 mL via INTRATHECAL

## 2018-07-20 NOTE — Discharge Instructions (Signed)
Myelogram Discharge Instructions  1. Go home and rest quietly for the next 24 hours.  It is important to lie flat for the next 24 hours.  Get up only to go to the restroom.  You may lie in the bed or on a couch on your back, your stomach, your left side or your right side.  You may have one pillow under your head.  You may have pillows between your knees while you are on your side or under your knees while you are on your back.  2. DO NOT drive today.  Recline the seat as far back as it will go, while still wearing your seat belt, on the way home.  3. You may get up to go to the bathroom as needed.  You may sit up for 10 minutes to eat.  You may resume your normal diet and medications unless otherwise indicated.  Drink lots of extra fluids today and tomorrow.  4. The incidence of headache, nausea, or vomiting is about 5% (one in 20 patients).  If you develop a headache, lie flat and drink plenty of fluids until the headache goes away.  Caffeinated beverages may be helpful.  If you develop severe nausea and vomiting or a headache that does not go away with flat bed rest, call [phone].  5. You may resume normal activities after your 24 hours of bed rest is over; however, do not exert yourself strongly or do any heavy lifting tomorrow. If when you get up you have a headache when standing, go back to bed and force fluids for another 24 hours.  6. Call your physician for a follow-up appointment.  The results of your myelogram will be sent directly to your physician by the following day.  7. If you have any questions or if complications develop after you arrive home, please call [phone].  Discharge instructions have been explained to the patient.  The patient, or the person responsible for the patient, fully understands these instructions.  YOU MAY RESTART YOUR ELAVIL AND LEXAPRO TOMORROW 07/21/2018 AT 08:10AM.

## 2018-07-21 DIAGNOSIS — J3089 Other allergic rhinitis: Secondary | ICD-10-CM | POA: Diagnosis not present

## 2018-07-21 DIAGNOSIS — J3081 Allergic rhinitis due to animal (cat) (dog) hair and dander: Secondary | ICD-10-CM | POA: Diagnosis not present

## 2018-07-21 DIAGNOSIS — J301 Allergic rhinitis due to pollen: Secondary | ICD-10-CM | POA: Diagnosis not present

## 2018-07-29 DIAGNOSIS — J3089 Other allergic rhinitis: Secondary | ICD-10-CM | POA: Diagnosis not present

## 2018-07-29 DIAGNOSIS — J3081 Allergic rhinitis due to animal (cat) (dog) hair and dander: Secondary | ICD-10-CM | POA: Diagnosis not present

## 2018-07-29 DIAGNOSIS — J301 Allergic rhinitis due to pollen: Secondary | ICD-10-CM | POA: Diagnosis not present

## 2018-07-30 DIAGNOSIS — E559 Vitamin D deficiency, unspecified: Secondary | ICD-10-CM | POA: Diagnosis not present

## 2018-07-30 DIAGNOSIS — N289 Disorder of kidney and ureter, unspecified: Secondary | ICD-10-CM | POA: Diagnosis not present

## 2018-07-30 DIAGNOSIS — R7301 Impaired fasting glucose: Secondary | ICD-10-CM | POA: Diagnosis not present

## 2018-08-03 DIAGNOSIS — E559 Vitamin D deficiency, unspecified: Secondary | ICD-10-CM | POA: Diagnosis not present

## 2018-08-03 DIAGNOSIS — N289 Disorder of kidney and ureter, unspecified: Secondary | ICD-10-CM | POA: Diagnosis not present

## 2018-08-03 DIAGNOSIS — J3089 Other allergic rhinitis: Secondary | ICD-10-CM | POA: Diagnosis not present

## 2018-08-03 DIAGNOSIS — J3081 Allergic rhinitis due to animal (cat) (dog) hair and dander: Secondary | ICD-10-CM | POA: Diagnosis not present

## 2018-08-03 DIAGNOSIS — Z79899 Other long term (current) drug therapy: Secondary | ICD-10-CM | POA: Diagnosis not present

## 2018-08-03 DIAGNOSIS — R7301 Impaired fasting glucose: Secondary | ICD-10-CM | POA: Diagnosis not present

## 2018-08-03 DIAGNOSIS — J301 Allergic rhinitis due to pollen: Secondary | ICD-10-CM | POA: Diagnosis not present

## 2018-08-12 DIAGNOSIS — J3089 Other allergic rhinitis: Secondary | ICD-10-CM | POA: Diagnosis not present

## 2018-08-12 DIAGNOSIS — J301 Allergic rhinitis due to pollen: Secondary | ICD-10-CM | POA: Diagnosis not present

## 2018-08-12 DIAGNOSIS — J3081 Allergic rhinitis due to animal (cat) (dog) hair and dander: Secondary | ICD-10-CM | POA: Diagnosis not present

## 2018-08-17 DIAGNOSIS — Z79899 Other long term (current) drug therapy: Secondary | ICD-10-CM | POA: Diagnosis not present

## 2018-08-17 DIAGNOSIS — T50905S Adverse effect of unspecified drugs, medicaments and biological substances, sequela: Secondary | ICD-10-CM | POA: Diagnosis not present

## 2018-08-18 DIAGNOSIS — Z6822 Body mass index (BMI) 22.0-22.9, adult: Secondary | ICD-10-CM | POA: Diagnosis not present

## 2018-08-18 DIAGNOSIS — R159 Full incontinence of feces: Secondary | ICD-10-CM | POA: Diagnosis not present

## 2018-08-19 DIAGNOSIS — J3089 Other allergic rhinitis: Secondary | ICD-10-CM | POA: Diagnosis not present

## 2018-08-19 DIAGNOSIS — J3081 Allergic rhinitis due to animal (cat) (dog) hair and dander: Secondary | ICD-10-CM | POA: Diagnosis not present

## 2018-08-19 DIAGNOSIS — J301 Allergic rhinitis due to pollen: Secondary | ICD-10-CM | POA: Diagnosis not present

## 2018-08-20 DIAGNOSIS — R1084 Generalized abdominal pain: Secondary | ICD-10-CM | POA: Diagnosis not present

## 2018-08-20 DIAGNOSIS — K91850 Pouchitis: Secondary | ICD-10-CM | POA: Diagnosis not present

## 2018-08-20 DIAGNOSIS — R159 Full incontinence of feces: Secondary | ICD-10-CM | POA: Diagnosis not present

## 2018-08-24 DIAGNOSIS — J3081 Allergic rhinitis due to animal (cat) (dog) hair and dander: Secondary | ICD-10-CM | POA: Diagnosis not present

## 2018-08-24 DIAGNOSIS — J3089 Other allergic rhinitis: Secondary | ICD-10-CM | POA: Diagnosis not present

## 2018-08-24 DIAGNOSIS — J301 Allergic rhinitis due to pollen: Secondary | ICD-10-CM | POA: Diagnosis not present

## 2018-08-25 DIAGNOSIS — Z6822 Body mass index (BMI) 22.0-22.9, adult: Secondary | ICD-10-CM | POA: Diagnosis not present

## 2018-08-25 DIAGNOSIS — J301 Allergic rhinitis due to pollen: Secondary | ICD-10-CM | POA: Diagnosis not present

## 2018-08-25 DIAGNOSIS — R1084 Generalized abdominal pain: Secondary | ICD-10-CM | POA: Diagnosis not present

## 2018-08-25 DIAGNOSIS — K91850 Pouchitis: Secondary | ICD-10-CM | POA: Diagnosis not present

## 2018-08-25 DIAGNOSIS — R159 Full incontinence of feces: Secondary | ICD-10-CM | POA: Diagnosis not present

## 2018-08-29 DIAGNOSIS — Z1159 Encounter for screening for other viral diseases: Secondary | ICD-10-CM | POA: Diagnosis not present

## 2018-08-31 DIAGNOSIS — J3089 Other allergic rhinitis: Secondary | ICD-10-CM | POA: Diagnosis not present

## 2018-08-31 DIAGNOSIS — J3081 Allergic rhinitis due to animal (cat) (dog) hair and dander: Secondary | ICD-10-CM | POA: Diagnosis not present

## 2018-08-31 DIAGNOSIS — J301 Allergic rhinitis due to pollen: Secondary | ICD-10-CM | POA: Diagnosis not present

## 2018-09-01 DIAGNOSIS — K869 Disease of pancreas, unspecified: Secondary | ICD-10-CM | POA: Diagnosis not present

## 2018-09-01 DIAGNOSIS — K5190 Ulcerative colitis, unspecified, without complications: Secondary | ICD-10-CM | POA: Diagnosis not present

## 2018-09-01 DIAGNOSIS — R933 Abnormal findings on diagnostic imaging of other parts of digestive tract: Secondary | ICD-10-CM | POA: Diagnosis not present

## 2018-09-01 DIAGNOSIS — K8689 Other specified diseases of pancreas: Secondary | ICD-10-CM | POA: Diagnosis not present

## 2018-09-02 DIAGNOSIS — R1084 Generalized abdominal pain: Secondary | ICD-10-CM | POA: Diagnosis not present

## 2018-09-02 DIAGNOSIS — K8689 Other specified diseases of pancreas: Secondary | ICD-10-CM | POA: Diagnosis not present

## 2018-09-02 DIAGNOSIS — R159 Full incontinence of feces: Secondary | ICD-10-CM | POA: Diagnosis not present

## 2018-09-02 DIAGNOSIS — K91850 Pouchitis: Secondary | ICD-10-CM | POA: Diagnosis not present

## 2018-09-09 DIAGNOSIS — J3081 Allergic rhinitis due to animal (cat) (dog) hair and dander: Secondary | ICD-10-CM | POA: Diagnosis not present

## 2018-09-09 DIAGNOSIS — J3089 Other allergic rhinitis: Secondary | ICD-10-CM | POA: Diagnosis not present

## 2018-09-09 DIAGNOSIS — J301 Allergic rhinitis due to pollen: Secondary | ICD-10-CM | POA: Diagnosis not present

## 2018-09-16 DIAGNOSIS — J3081 Allergic rhinitis due to animal (cat) (dog) hair and dander: Secondary | ICD-10-CM | POA: Diagnosis not present

## 2018-09-16 DIAGNOSIS — J301 Allergic rhinitis due to pollen: Secondary | ICD-10-CM | POA: Diagnosis not present

## 2018-09-16 DIAGNOSIS — J3089 Other allergic rhinitis: Secondary | ICD-10-CM | POA: Diagnosis not present

## 2018-09-23 DIAGNOSIS — J3089 Other allergic rhinitis: Secondary | ICD-10-CM | POA: Diagnosis not present

## 2018-09-23 DIAGNOSIS — J301 Allergic rhinitis due to pollen: Secondary | ICD-10-CM | POA: Diagnosis not present

## 2018-09-23 DIAGNOSIS — J3081 Allergic rhinitis due to animal (cat) (dog) hair and dander: Secondary | ICD-10-CM | POA: Diagnosis not present

## 2018-09-29 DIAGNOSIS — J3081 Allergic rhinitis due to animal (cat) (dog) hair and dander: Secondary | ICD-10-CM | POA: Diagnosis not present

## 2018-09-29 DIAGNOSIS — J3089 Other allergic rhinitis: Secondary | ICD-10-CM | POA: Diagnosis not present

## 2018-09-29 DIAGNOSIS — J301 Allergic rhinitis due to pollen: Secondary | ICD-10-CM | POA: Diagnosis not present

## 2018-09-30 DIAGNOSIS — M5416 Radiculopathy, lumbar region: Secondary | ICD-10-CM | POA: Diagnosis not present

## 2018-10-01 DIAGNOSIS — R1084 Generalized abdominal pain: Secondary | ICD-10-CM | POA: Diagnosis not present

## 2018-10-01 DIAGNOSIS — R159 Full incontinence of feces: Secondary | ICD-10-CM | POA: Diagnosis not present

## 2018-10-01 DIAGNOSIS — K91850 Pouchitis: Secondary | ICD-10-CM | POA: Diagnosis not present

## 2018-10-01 DIAGNOSIS — K8689 Other specified diseases of pancreas: Secondary | ICD-10-CM | POA: Diagnosis not present

## 2018-10-07 DIAGNOSIS — J301 Allergic rhinitis due to pollen: Secondary | ICD-10-CM | POA: Diagnosis not present

## 2018-10-07 DIAGNOSIS — J3089 Other allergic rhinitis: Secondary | ICD-10-CM | POA: Diagnosis not present

## 2018-10-07 DIAGNOSIS — J3081 Allergic rhinitis due to animal (cat) (dog) hair and dander: Secondary | ICD-10-CM | POA: Diagnosis not present

## 2018-10-09 DIAGNOSIS — Z23 Encounter for immunization: Secondary | ICD-10-CM | POA: Diagnosis not present

## 2018-10-13 DIAGNOSIS — J3089 Other allergic rhinitis: Secondary | ICD-10-CM | POA: Diagnosis not present

## 2018-10-13 DIAGNOSIS — J301 Allergic rhinitis due to pollen: Secondary | ICD-10-CM | POA: Diagnosis not present

## 2018-10-13 DIAGNOSIS — M5416 Radiculopathy, lumbar region: Secondary | ICD-10-CM | POA: Diagnosis not present

## 2018-10-13 DIAGNOSIS — J3081 Allergic rhinitis due to animal (cat) (dog) hair and dander: Secondary | ICD-10-CM | POA: Diagnosis not present

## 2018-10-26 DIAGNOSIS — J3081 Allergic rhinitis due to animal (cat) (dog) hair and dander: Secondary | ICD-10-CM | POA: Diagnosis not present

## 2018-10-26 DIAGNOSIS — J301 Allergic rhinitis due to pollen: Secondary | ICD-10-CM | POA: Diagnosis not present

## 2018-10-26 DIAGNOSIS — J3089 Other allergic rhinitis: Secondary | ICD-10-CM | POA: Diagnosis not present

## 2018-10-29 DIAGNOSIS — G43109 Migraine with aura, not intractable, without status migrainosus: Secondary | ICD-10-CM | POA: Diagnosis not present

## 2018-11-02 DIAGNOSIS — Z1159 Encounter for screening for other viral diseases: Secondary | ICD-10-CM | POA: Diagnosis not present

## 2018-11-02 DIAGNOSIS — Z20828 Contact with and (suspected) exposure to other viral communicable diseases: Secondary | ICD-10-CM | POA: Diagnosis not present

## 2018-11-02 DIAGNOSIS — Z01812 Encounter for preprocedural laboratory examination: Secondary | ICD-10-CM | POA: Diagnosis not present

## 2018-11-04 DIAGNOSIS — K5190 Ulcerative colitis, unspecified, without complications: Secondary | ICD-10-CM | POA: Diagnosis not present

## 2018-11-04 DIAGNOSIS — J45909 Unspecified asthma, uncomplicated: Secondary | ICD-10-CM | POA: Diagnosis not present

## 2018-11-04 DIAGNOSIS — K8689 Other specified diseases of pancreas: Secondary | ICD-10-CM | POA: Diagnosis not present

## 2018-11-04 DIAGNOSIS — R159 Full incontinence of feces: Secondary | ICD-10-CM | POA: Diagnosis not present

## 2018-11-04 DIAGNOSIS — Z4542 Encounter for adjustment and management of neuropacemaker (brain) (peripheral nerve) (spinal cord): Secondary | ICD-10-CM | POA: Diagnosis not present

## 2018-11-05 DIAGNOSIS — R55 Syncope and collapse: Secondary | ICD-10-CM | POA: Diagnosis not present

## 2018-11-05 DIAGNOSIS — Z91040 Latex allergy status: Secondary | ICD-10-CM | POA: Diagnosis not present

## 2018-11-05 DIAGNOSIS — G8911 Acute pain due to trauma: Secondary | ICD-10-CM | POA: Diagnosis not present

## 2018-11-05 DIAGNOSIS — Z79899 Other long term (current) drug therapy: Secondary | ICD-10-CM | POA: Diagnosis not present

## 2018-11-05 DIAGNOSIS — Z885 Allergy status to narcotic agent status: Secondary | ICD-10-CM | POA: Diagnosis not present

## 2018-11-05 DIAGNOSIS — S0990XA Unspecified injury of head, initial encounter: Secondary | ICD-10-CM | POA: Diagnosis not present

## 2018-11-05 DIAGNOSIS — S0181XA Laceration without foreign body of other part of head, initial encounter: Secondary | ICD-10-CM | POA: Diagnosis not present

## 2018-11-05 DIAGNOSIS — R519 Headache, unspecified: Secondary | ICD-10-CM | POA: Diagnosis not present

## 2018-11-05 DIAGNOSIS — R42 Dizziness and giddiness: Secondary | ICD-10-CM | POA: Diagnosis not present

## 2018-11-05 DIAGNOSIS — W19XXXA Unspecified fall, initial encounter: Secondary | ICD-10-CM | POA: Diagnosis not present

## 2018-11-05 DIAGNOSIS — S0993XA Unspecified injury of face, initial encounter: Secondary | ICD-10-CM | POA: Diagnosis not present

## 2018-11-11 DIAGNOSIS — R55 Syncope and collapse: Secondary | ICD-10-CM | POA: Diagnosis not present

## 2018-11-11 DIAGNOSIS — Z09 Encounter for follow-up examination after completed treatment for conditions other than malignant neoplasm: Secondary | ICD-10-CM | POA: Diagnosis not present

## 2018-11-11 DIAGNOSIS — J3081 Allergic rhinitis due to animal (cat) (dog) hair and dander: Secondary | ICD-10-CM | POA: Diagnosis not present

## 2018-11-11 DIAGNOSIS — R519 Headache, unspecified: Secondary | ICD-10-CM | POA: Diagnosis not present

## 2018-11-11 DIAGNOSIS — J301 Allergic rhinitis due to pollen: Secondary | ICD-10-CM | POA: Diagnosis not present

## 2018-11-11 DIAGNOSIS — S0181XD Laceration without foreign body of other part of head, subsequent encounter: Secondary | ICD-10-CM | POA: Diagnosis not present

## 2018-11-11 DIAGNOSIS — J3089 Other allergic rhinitis: Secondary | ICD-10-CM | POA: Diagnosis not present

## 2018-11-17 DIAGNOSIS — J3081 Allergic rhinitis due to animal (cat) (dog) hair and dander: Secondary | ICD-10-CM | POA: Diagnosis not present

## 2018-11-17 DIAGNOSIS — J3089 Other allergic rhinitis: Secondary | ICD-10-CM | POA: Diagnosis not present

## 2018-11-23 DIAGNOSIS — J3089 Other allergic rhinitis: Secondary | ICD-10-CM | POA: Diagnosis not present

## 2018-11-23 DIAGNOSIS — J301 Allergic rhinitis due to pollen: Secondary | ICD-10-CM | POA: Diagnosis not present

## 2018-11-23 DIAGNOSIS — J3081 Allergic rhinitis due to animal (cat) (dog) hair and dander: Secondary | ICD-10-CM | POA: Diagnosis not present

## 2018-11-24 DIAGNOSIS — M5416 Radiculopathy, lumbar region: Secondary | ICD-10-CM | POA: Diagnosis not present

## 2018-11-24 DIAGNOSIS — M545 Low back pain: Secondary | ICD-10-CM | POA: Diagnosis not present

## 2018-12-02 DIAGNOSIS — J3081 Allergic rhinitis due to animal (cat) (dog) hair and dander: Secondary | ICD-10-CM | POA: Diagnosis not present

## 2018-12-02 DIAGNOSIS — J301 Allergic rhinitis due to pollen: Secondary | ICD-10-CM | POA: Diagnosis not present

## 2018-12-02 DIAGNOSIS — J3089 Other allergic rhinitis: Secondary | ICD-10-CM | POA: Diagnosis not present

## 2018-12-09 DIAGNOSIS — J3081 Allergic rhinitis due to animal (cat) (dog) hair and dander: Secondary | ICD-10-CM | POA: Diagnosis not present

## 2018-12-09 DIAGNOSIS — J3089 Other allergic rhinitis: Secondary | ICD-10-CM | POA: Diagnosis not present

## 2018-12-09 DIAGNOSIS — J301 Allergic rhinitis due to pollen: Secondary | ICD-10-CM | POA: Diagnosis not present

## 2018-12-14 DIAGNOSIS — J301 Allergic rhinitis due to pollen: Secondary | ICD-10-CM | POA: Diagnosis not present

## 2018-12-14 DIAGNOSIS — J3089 Other allergic rhinitis: Secondary | ICD-10-CM | POA: Diagnosis not present

## 2018-12-14 DIAGNOSIS — J3081 Allergic rhinitis due to animal (cat) (dog) hair and dander: Secondary | ICD-10-CM | POA: Diagnosis not present

## 2018-12-21 DIAGNOSIS — J301 Allergic rhinitis due to pollen: Secondary | ICD-10-CM | POA: Diagnosis not present

## 2018-12-21 DIAGNOSIS — J3089 Other allergic rhinitis: Secondary | ICD-10-CM | POA: Diagnosis not present

## 2018-12-21 DIAGNOSIS — J3081 Allergic rhinitis due to animal (cat) (dog) hair and dander: Secondary | ICD-10-CM | POA: Diagnosis not present

## 2018-12-28 DIAGNOSIS — J301 Allergic rhinitis due to pollen: Secondary | ICD-10-CM | POA: Diagnosis not present

## 2018-12-28 DIAGNOSIS — J3081 Allergic rhinitis due to animal (cat) (dog) hair and dander: Secondary | ICD-10-CM | POA: Diagnosis not present

## 2018-12-28 DIAGNOSIS — J3089 Other allergic rhinitis: Secondary | ICD-10-CM | POA: Diagnosis not present

## 2019-02-25 ENCOUNTER — Other Ambulatory Visit: Payer: Self-pay | Admitting: Obstetrics and Gynecology

## 2019-02-25 DIAGNOSIS — R2231 Localized swelling, mass and lump, right upper limb: Secondary | ICD-10-CM

## 2019-03-09 ENCOUNTER — Ambulatory Visit
Admission: RE | Admit: 2019-03-09 | Discharge: 2019-03-09 | Disposition: A | Discharge status: Home / Self Care | Payer: BC Managed Care – PPO | Source: Ambulatory Visit | Attending: Obstetrics and Gynecology | Admitting: Obstetrics and Gynecology

## 2019-03-09 ENCOUNTER — Other Ambulatory Visit: Payer: Self-pay

## 2019-03-09 ENCOUNTER — Other Ambulatory Visit: Payer: BC Managed Care – PPO

## 2019-03-09 DIAGNOSIS — R2231 Localized swelling, mass and lump, right upper limb: Secondary | ICD-10-CM

## 2019-11-23 IMAGING — CT CT ABD-PELV W/ CM
2 of 5 series · 16 of 46 positions shown, 18 images · IV contrast (Omni 300)
Comparison: 09/05/2016

CLINICAL DATA: Nausea vomiting.

EXAM:
CT ABDOMEN AND PELVIS WITH CONTRAST
TECHNIQUE: Multidetector CT imaging of the abdomen and pelvis was performed
using the standard protocol following bolus administration of
intravenous contrast.
CONTRAST:  100 cc Isovue 300 intravenously.

[Series 3: a/p w/ 5mm · axial · 0.88mm/px · z∈[+944,+1319]mm · 13 of 87 slices shown, 15 images]
[im 6/87  soft-tissue]
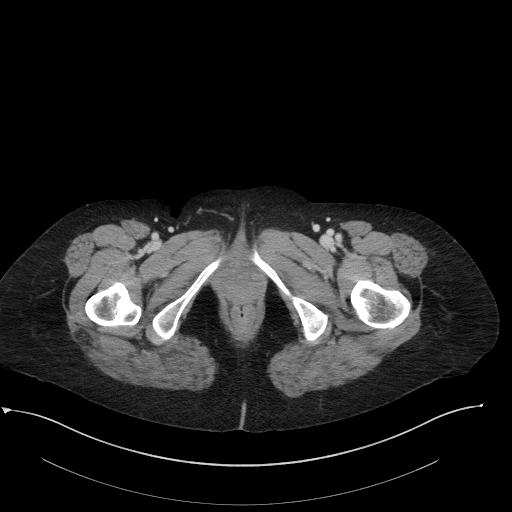
[im 6/87  bone]
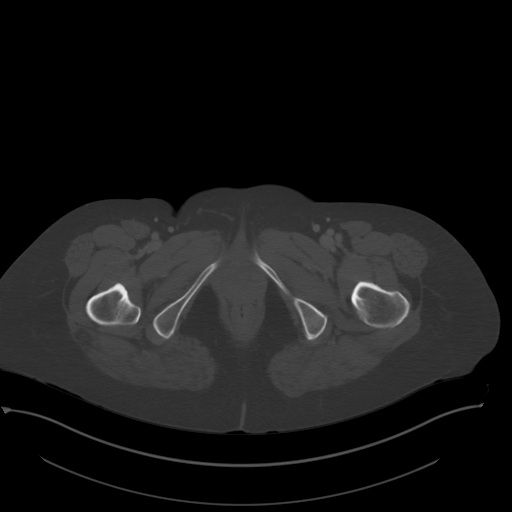
[im 11/87  soft-tissue]
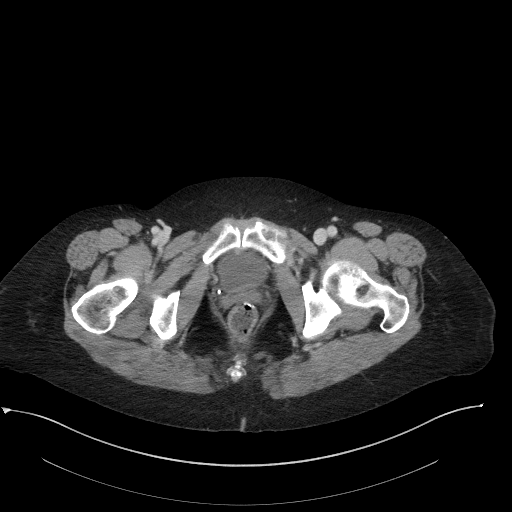
[im 21/87  soft-tissue]
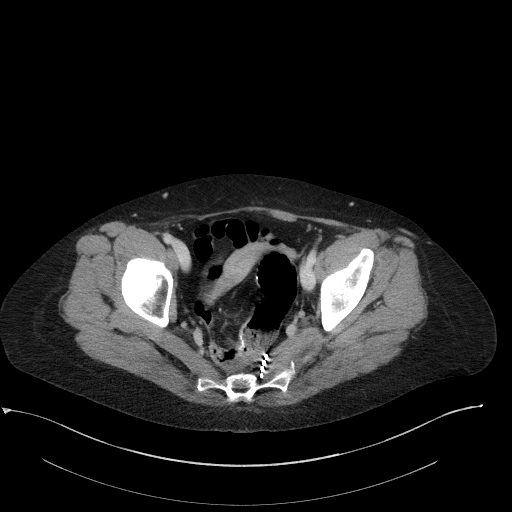
[im 26/87  soft-tissue]
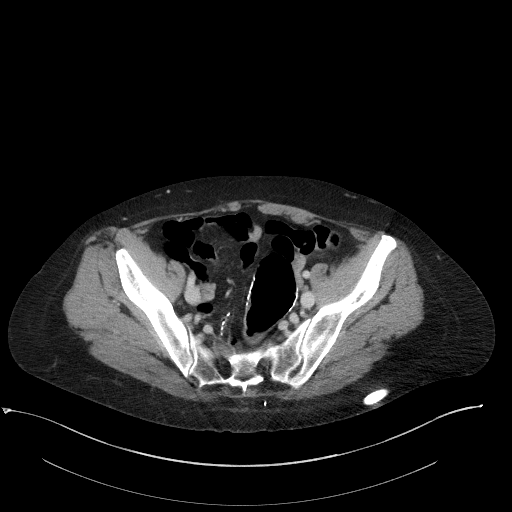
[im 31/87  soft-tissue]
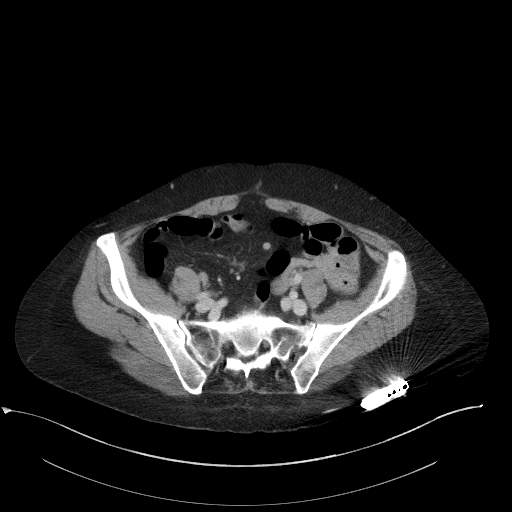
[im 36/87  soft-tissue]
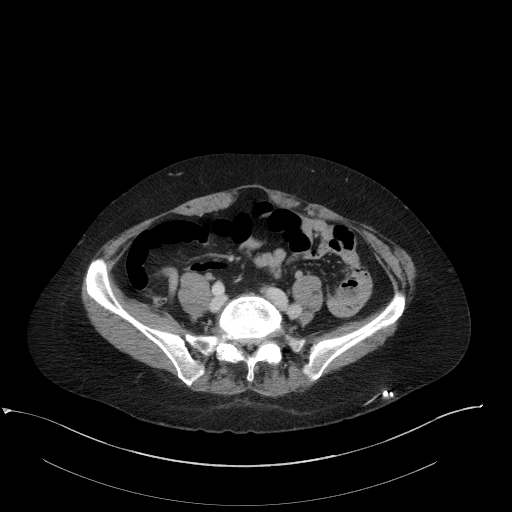
[im 46/87  soft-tissue]
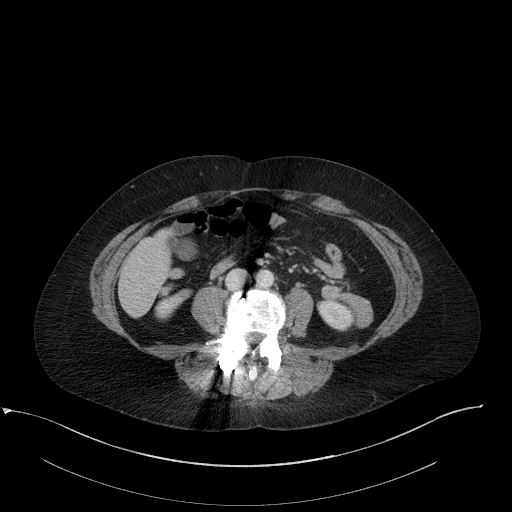
[im 51/87  soft-tissue]
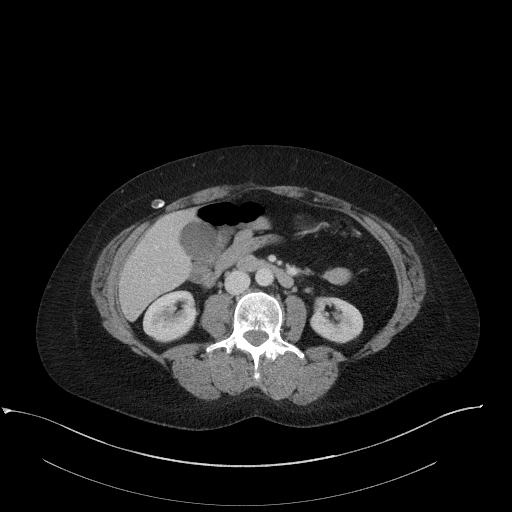
[im 56/87  soft-tissue]
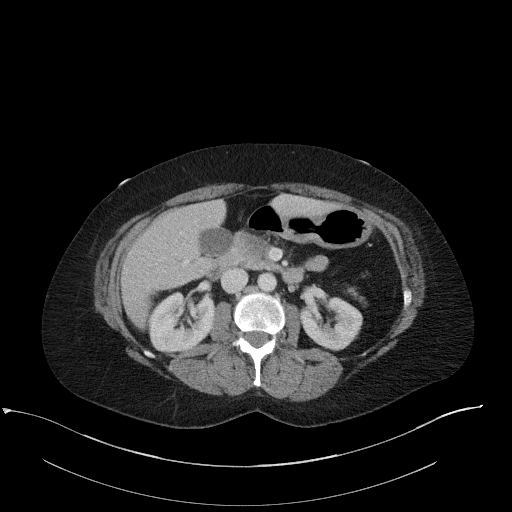
[im 56/87  bone]
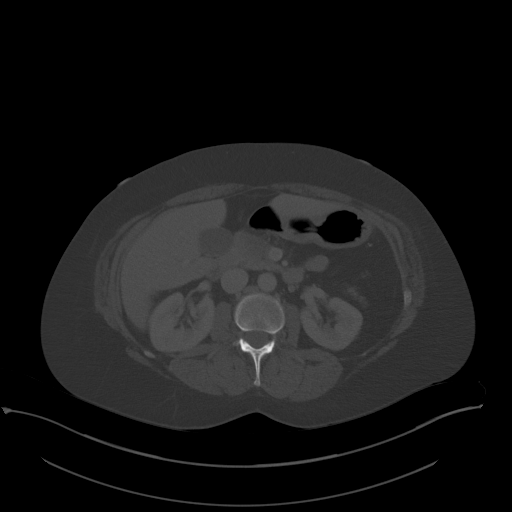
[im 61/87  soft-tissue]
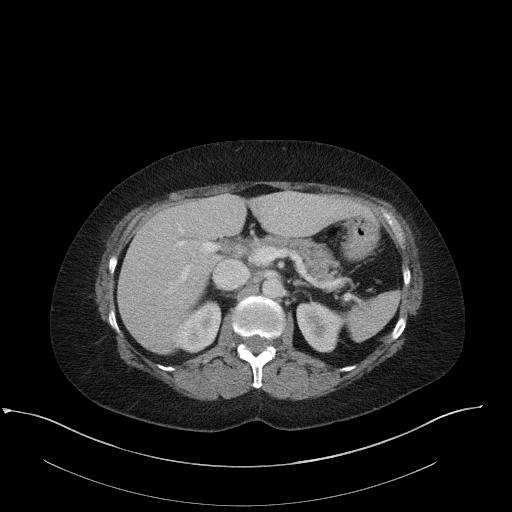
[im 66/87  soft-tissue]
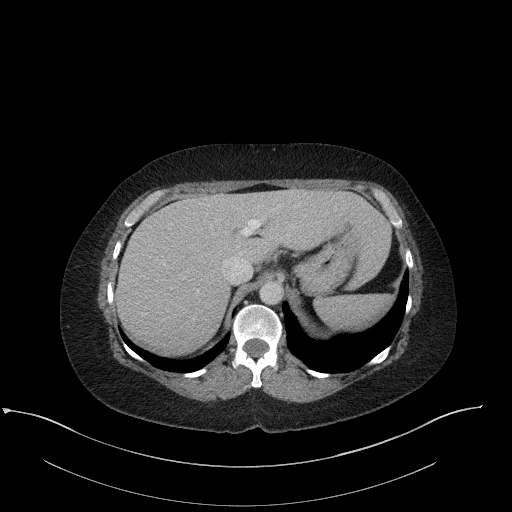
[im 76/87  soft-tissue]
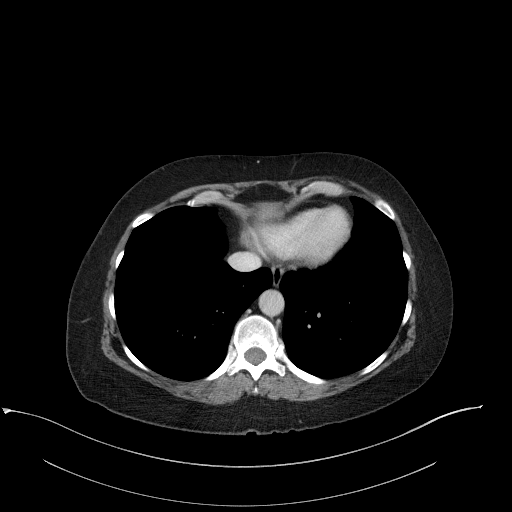
[im 81/87  soft-tissue]
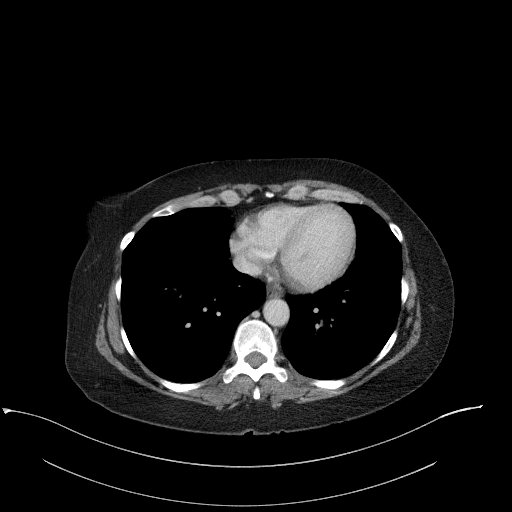

[Series 6: a/p w/ cor · coronal · 0.69mm/px · 3 of 125 slices shown]
[im 42/125  soft-tissue]
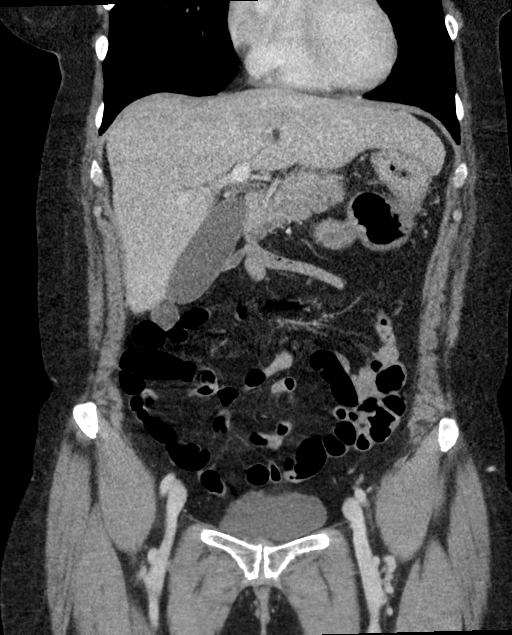
[im 56/125  soft-tissue]
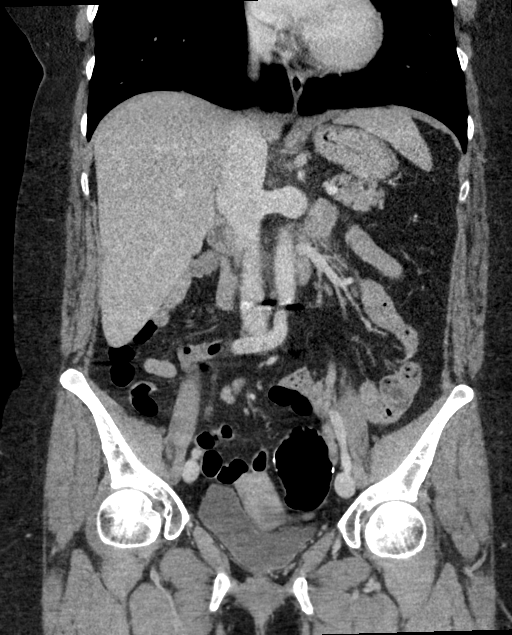
[im 69/125  soft-tissue]
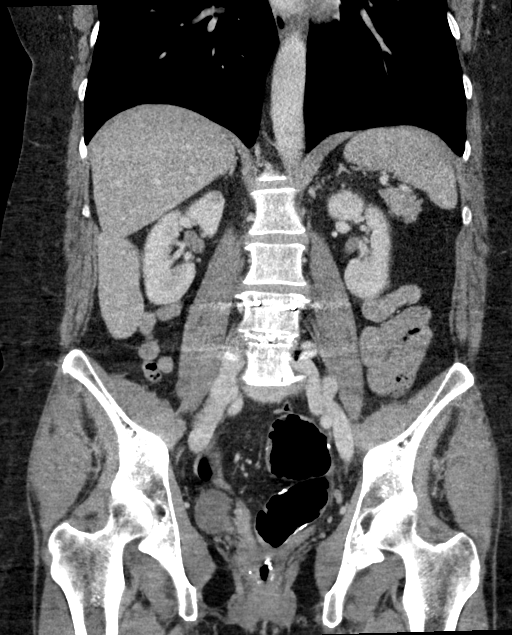

[16 of 46 positions shown; findings below may reference images not displayed]

FINDINGS: Lower chest: No acute abnormality.

Hepatobiliary: No focal liver abnormality is seen. No gallstones,
gallbladder wall thickening, or biliary dilatation.

Pancreas: Slightly hypoattenuated appearance of the head of the
pancreas without associated peripancreatic fat stranding. No
pancreatic ductal dilatation or surrounding inflammatory changes.

Spleen: Normal in size without focal abnormality.

Adrenals/Urinary Tract: Adrenal glands are unremarkable. Kidneys are
normal, without renal calculi, focal lesion, or hydronephrosis.
Bladder is unremarkable.

Stomach/Bowel: Stomach is within normal limits. Stable postsurgical
changes from prior coloproctectomy with ileal J-pouch formation.No
evidence of bowel wall thickening, distention, or inflammatory
changes.

Vascular/Lymphatic: No significant vascular findings are present. No
enlarged abdominal or pelvic lymph nodes.

Reproductive: Uterus and bilateral adnexa are unremarkable.

Other: No abdominal wall hernia or abnormality. No abdominopelvic
ascites.

Musculoskeletal: Simulated device present within the left lateral
abdominal wall, with lead entering at the level of the distal left
sacrum.. Sequela of prior L4-L5 fusion.
IMPRESSION: Slightly hypoattenuated appearance of the head of the pancreas
without associated peripancreatic fat stranding. Please correlate to
serum pancreatic enzymes to exclude acute pancreatitis.

Otherwise no acute abnormalities within the solid abdominal organs.

Stable postsurgical changes from coloproctectomy and ileal J-pouch
formation without complicating features.

## 2019-11-23 IMAGING — CR DG FOOT COMPLETE 3+V*R*
3 series · 3 of 3 positions shown · non-contrast
Comparison: Right foot radiograph 09/02/2016

CLINICAL DATA: Foot pain status post surgery

EXAM:
RIGHT FOOT COMPLETE - 3+ VIEW

[foot ap]
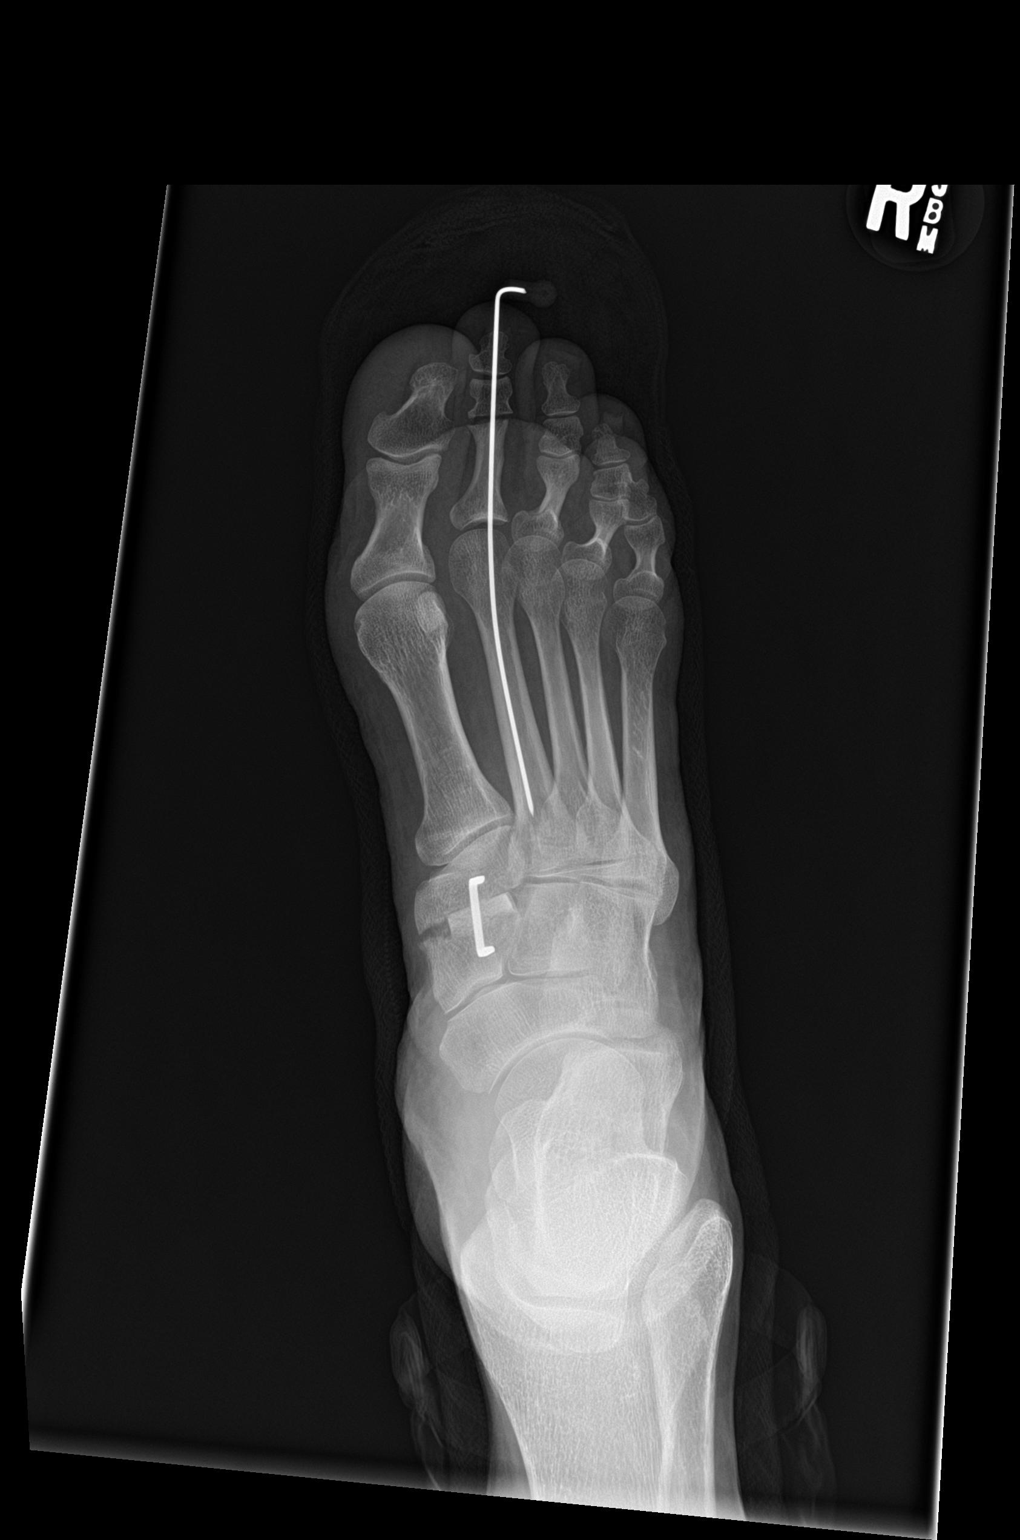

[foot obl]
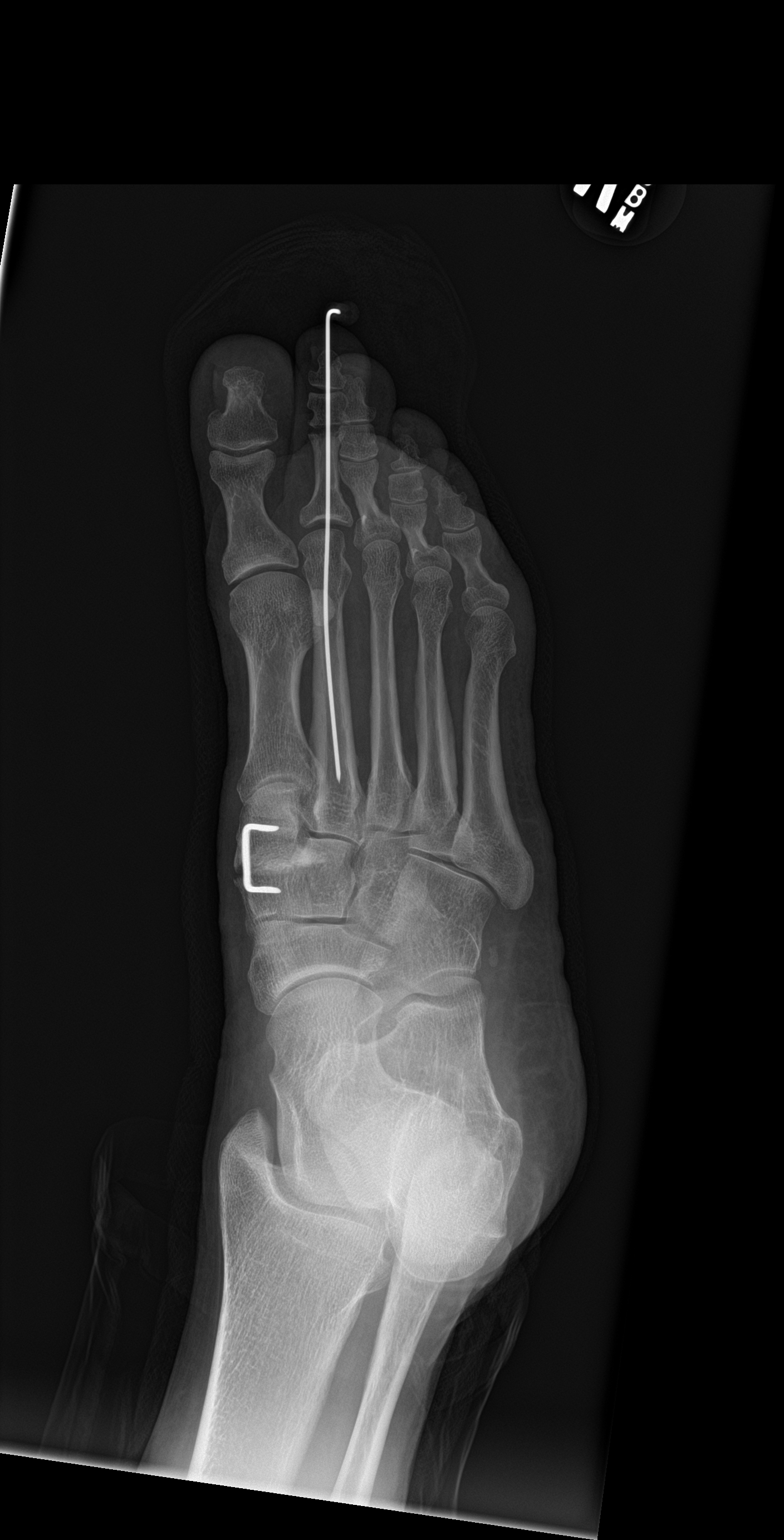

[foot lat]
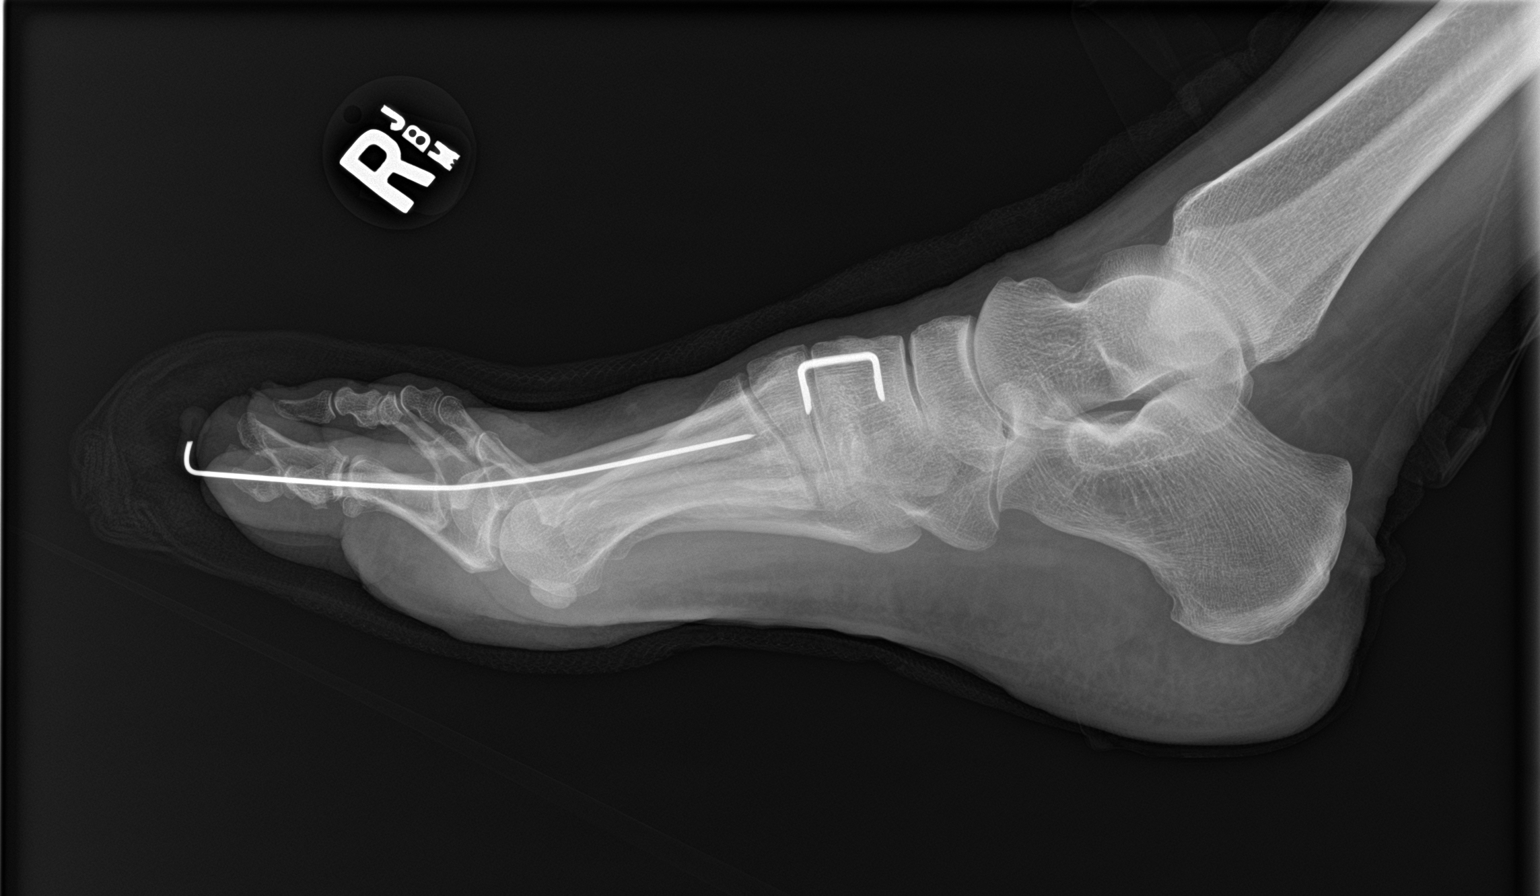

[3 of 3 positions shown; findings below may reference images not displayed]

FINDINGS: There is a percutaneous wire traversing the second ray from the
distal phalanx to the proximal metatarsal. There is a staple with
spacer device at the medial cuneiform. There is no abnormal lucency
surrounding any of the hardware. No acute fracture or dislocation.
IMPRESSION: Postsurgical changes of the right foot without acute abnormality.
